# Patient Record
Sex: Female | Born: 1980 | Marital: Married | Smoking: Never smoker
Health system: Southern US, Community
[De-identification: ages and names within clinical notes are randomized; demographics above are authoritative.]

## PROBLEM LIST (undated history)

## (undated) DIAGNOSIS — B977 Papillomavirus as the cause of diseases classified elsewhere: Secondary | ICD-10-CM

## (undated) DIAGNOSIS — R87619 Unspecified abnormal cytological findings in specimens from cervix uteri: Secondary | ICD-10-CM

## (undated) DIAGNOSIS — G43109 Migraine with aura, not intractable, without status migrainosus: Secondary | ICD-10-CM

## (undated) DIAGNOSIS — R51 Headache: Secondary | ICD-10-CM

## (undated) HISTORY — DX: Gilbert syndrome: E80.4

## (undated) HISTORY — DX: Migraine with aura, not intractable, without status migrainosus: G43.109

## (undated) HISTORY — DX: Headache: R51

## (undated) HISTORY — DX: Unspecified abnormal cytological findings in specimens from cervix uteri: R87.619

## (undated) HISTORY — DX: Papillomavirus as the cause of diseases classified elsewhere: B97.7

## (undated) HISTORY — PX: COLPOSCOPY: SHX161

---

## 2014-07-23 DIAGNOSIS — R87810 Cervical high risk human papillomavirus (HPV) DNA test positive: Secondary | ICD-10-CM | POA: Insufficient documentation

## 2014-07-23 DIAGNOSIS — R87610 Atypical squamous cells of undetermined significance on cytologic smear of cervix (ASC-US): Secondary | ICD-10-CM | POA: Insufficient documentation

## 2015-09-29 ENCOUNTER — Encounter: Payer: Self-pay | Admitting: Obstetrics and Gynecology

## 2015-09-29 ENCOUNTER — Ambulatory Visit (INDEPENDENT_AMBULATORY_CARE_PROVIDER_SITE_OTHER): Payer: Managed Care, Other (non HMO) | Admitting: Obstetrics and Gynecology

## 2015-09-29 VITALS — BP 132/80 | HR 64 | Resp 14 | Ht 67.0 in | Wt 157.0 lb

## 2015-09-29 DIAGNOSIS — Z8742 Personal history of other diseases of the female genital tract: Secondary | ICD-10-CM

## 2015-09-29 DIAGNOSIS — N6012 Diffuse cystic mastopathy of left breast: Secondary | ICD-10-CM

## 2015-09-29 DIAGNOSIS — Z01419 Encounter for gynecological examination (general) (routine) without abnormal findings: Secondary | ICD-10-CM

## 2015-09-29 DIAGNOSIS — Z124 Encounter for screening for malignant neoplasm of cervix: Secondary | ICD-10-CM

## 2015-09-29 MED ORDER — NORETHIN ACE-ETH ESTRAD-FE 1.5-30 MG-MCG PO TABS: 1.0000 | ORAL_TABLET | Freq: Every day | ORAL | Status: DC

## 2015-09-29 NOTE — Progress Notes (Signed)
Patient ID: [name], female   DOB: September 05, 1980, 35 y.o.   MRN: [id number] 35 y.o. [id number] MarriedCaucasianF here for annual exam. Happy on OCP's. No dyspareunia.  Period Cycle (Days): 28 Period Duration (Days): 2-3 days  Period Pattern: Regular Menstrual Flow: Light Menstrual Control: Tampon Dysmenorrhea: None  Patient's last menstrual period was 09/29/2015.          Sexually active: Yes.    The current method of family planning is OCP  (estrogen/progesterone).    Exercising: Yes.    cardio and weights  Smoker:  no  Health Maintenance: Pap:  06/2014 Abnormal -had a colposcopy. That was her 2 or 3 abnormal. +HPV. No h/o surgery on her cervix.  History of abnormal Pap:  yes TDaP:  Unsure, thinks she had it a few years ago Garasil: no   reports that she has never smoked. She has never used smokeless tobacco. She reports that she drinks about 3.0 oz of alcohol per week. She reports that she does not use illicit drugs.Kids are kids are 2 and 4 (both girls). She is a stay at home mom. Just moved here from [facility].   Past Medical History  Diagnosis Date  . Abnormal Pap smear of cervix   H/O HTN prior to pregnancy, fine since. No weight changes.   Past Surgical History  Procedure Laterality Date  . Colposcopy    . Cesarean section      Current Outpatient Prescriptions  Medication Sig Dispense Refill  . norethindrone-ethinyl estradiol-iron (MICROGESTIN FE,GILDESS FE,LOESTRIN FE) 1.5-30 MG-MCG tablet Take 1 tablet by mouth daily.     No current facility-administered medications for this visit.    Family History  Problem Relation Age of Onset  . Hypertension Mother   . Lung cancer Mother     non smoker    Review of Systems  Constitutional: Negative.   HENT: Negative.   Eyes: Negative.   Respiratory: Negative.   Cardiovascular: Negative.   Gastrointestinal: Negative.   Endocrine: Negative.   Genitourinary: Negative.   Musculoskeletal: Negative.   Skin: Negative.    Allergic/Immunologic: Negative.   Neurological: Negative.   Psychiatric/Behavioral: Negative.     Exam:   BP 132/80 mmHg  Pulse 64  Resp 14  Ht  (1.702 m)  Wt 157 lb (71.215 kg)  BMI 24.58 kg/m2  LMP 09/29/2015  Weight change: @ Height:   Height:  (170.2 cm)  Ht Readings from Last 3 Encounters:  09/29/15  (1.702 m)    General appearance: alert, cooperative and appears stated age Head: Normocephalic, without obvious abnormality, atraumatic Neck: no adenopathy, supple, symmetrical, trachea midline and thyroid normal to inspection and palpation Lungs: clear to auscultation bilaterally Breasts: increased nodularity in the left breast at 9 o'clock just outside the areolar region. About 1 cm area. No skin dimpling or adenopathy.  Heart: regular rate and rhythm Abdomen: soft, non-tender; bowel sounds normal; no masses,  no organomegaly Extremities: extremities normal, atraumatic, no cyanosis or edema Skin: Skin color, texture, turgor normal. No rashes or lesions Lymph nodes: Cervical, supraclavicular, and axillary nodes normal. No abnormal inguinal nodes palpated Neurologic: Grossly normal   Pelvic: External genitalia:  no lesions              Urethra:  normal appearing urethra with no masses, tenderness or lesions              Bartholins and Skenes: normal  Vagina: normal appearing vagina with normal color and discharge, no lesions              Cervix: no lesions               Bimanual Exam:  Uterus:  normal size, contour, position, consistency, mobility, non-tender and anteverted              Adnexa: no mass, fullness, tenderness               Rectovaginal: Confirms               Anus:  normal sphincter tone, no lesions  Chaperone was present for exam.  A:  Well Woman with normal exam  H/O abnormal pap  Contraception  Increased nodularity in her right breast, suspect fibrocystic changes  P:   Pap with hpv  She will establish  care with a primary MD, will do labs there  She will have her records sent, will check on TDAP  Discussed breast self exam  Discussed calcium and vit D intake  Continue OCP's  Come back for a breast check after her cycle, if still present will set up imaging

## 2015-09-29 NOTE — Patient Instructions (Signed)
Breast Self-Awareness Practicing breast self-awareness may pick up problems early, prevent significant medical complications, and possibly save your life. By practicing breast self-awareness, you can become familiar with how your breasts look and feel and if your breasts are changing. This allows you to notice changes early. It can also offer you some reassurance that your breast health is good. One way to learn what is normal for your breasts and whether your breasts are changing is to do a breast self-exam. If you find a lump or something that was not present in the past, it is best to contact your caregiver right away. Other findings that should be evaluated by your caregiver include nipple discharge, especially if it is bloody; skin changes or reddening; areas where the skin seems to be pulled in (retracted); or new lumps and bumps. Breast pain is seldom associated with cancer (malignancy), but should also be evaluated by a caregiver. HOW TO PERFORM A BREAST SELF-EXAM The best time to examine your breasts is 5-7 days after your menstrual period is over. During menstruation, the breasts are lumpier, and it may be more difficult to pick up changes. If you do not menstruate, have reached menopause, or had your uterus removed (hysterectomy), you should examine your breasts at regular intervals, such as monthly. If you are breastfeeding, examine your breasts after a feeding or after using a breast pump. Breast implants do not decrease the risk for lumps or tumors, so continue to perform breast self-exams as recommended. Talk to your caregiver about how to determine the difference between the implant and breast tissue. Also, talk about the amount of pressure you should use during the exam. Over time, you will become more familiar with the variations of your breasts and more comfortable with the exam. A breast self-exam requires you to remove all your clothes above the waist. 1. Look at your breasts and nipples.  Stand in front of a mirror in a room with good lighting. With your hands on your hips, push your hands firmly downward. Look for a difference in shape, contour, and size from one breast to the other (asymmetry). Asymmetry includes puckers, dips, or bumps. Also, look for skin changes, such as reddened or scaly areas on the breasts. Look for nipple changes, such as discharge, dimpling, repositioning, or redness. 2. Carefully feel your breasts. This is best done either in the shower or tub while using soapy water or when flat on your back. Place the arm (on the side of the breast you are examining) above your head. Use the pads (not the fingertips) of your three middle fingers on your opposite hand to feel your breasts. Start in the underarm area and use  inch (2 cm) overlapping circles to feel your breast. Use 3 different levels of pressure (light, medium, and firm pressure) at each circle before moving to the next circle. The light pressure is needed to feel the tissue closest to the skin. The medium pressure will help to feel breast tissue a little deeper, while the firm pressure is needed to feel the tissue close to the ribs. Continue the overlapping circles, moving downward over the breast until you feel your ribs below your breast. Then, move one finger-width towards the center of the body. Continue to use the  inch (2 cm) overlapping circles to feel your breast as you move slowly up toward the collar bone (clavicle) near the base of the neck. Continue the up and down exam using all 3 pressures until you reach the   middle of the chest. Do this with each breast, carefully feeling for lumps or changes. 3.  Keep a written record with breast changes or normal findings for each breast. By writing this information down, you do not need to depend only on memory for size, tenderness, or location. Write down where you are in your menstrual cycle, if you are still menstruating. Breast tissue can have some lumps or  thick tissue. However, see your caregiver if you find anything that concerns you.  SEEK MEDICAL CARE IF:  You see a change in shape, contour, or size of your breasts or nipples.   You see skin changes, such as reddened or scaly areas on the breasts or nipples.   You have an unusual discharge from your nipples.   You feel a new lump or unusually thick areas.    This information is not intended to replace advice given to you by your health care provider. Make sure you discuss any questions you have with your health care provider.   Document Released: 04/30/2005 Document Revised: 04/16/2012 Document Reviewed: 08/15/2011 Elsevier Interactive Patient Education 2016 Else[employer] Inc[employer]   EXERCISE AND DIET:  We recommended that you start or continue a regular exercise program for good health. Regular exercise means any activity that makes your heart beat faster and makes you sweat.  We recommend exercising at least 30 minutes per day at least 3 days a week, preferably 4 or 5.  We also recommend a diet low in fat and sugar.  Inactivity, poor dietary choices and obesity can cause diabetes, heart attack, stroke, and kidney damage, among others.    ALCOHOL AND SMOKING:  Women should limit their alcohol intake to no more than 7 drinks/beers/glasses of wine (combined, not each!) per week. Moderation of alcohol intake to this level decreases your risk of breast cancer and liver damage. And of course, no recreational drugs are part of a healthy lifestyle.  And absolutely no smoking or even second hand smoke. Most people know smoking can cause heart and lung diseases, but did you know it also contributes to weakening of your bones? Aging of your skin?  Yellowing of your teeth and nails?  CALCIUM AND VITAMIN D:  Adequate intake of calcium and Vitamin D are recommended.  The recommendations for exact amounts of these supplements seem to change often, but generally speaking 600 mg of calcium (either carbonate or  citrate) and 800 units of Vitamin D per day seems prudent. Certain women may benefit from higher intake of Vitamin D.  If you are among these women, your doctor will have told you during your visit.    PAP SMEARS:  Pap smears, to check for cervical cancer or precancers,  have traditionally been done yearly, although recent scientific advances have shown that most women can have pap smears less often.  However, every woman still should have a physical exam from her gynecologist every year. It will include a breast check, inspection of the vulva and vagina to check for abnormal growths or skin changes, a visual exam of the cervix, and then an exam to evaluate the size and shape of the uterus and ovaries.  And after 34 years of age, a rectal exam is indicated to check for rectal cancers. We will also provide age appropriate advice regarding health maintenance, like when you should have certain vaccines, screening for sexually transmitted diseases, bone density testing, colonoscopy, mammograms, etc.   MAMMOGRAMS:  All women over 40 years old should have a yearly   mammogram. Many facilities now offer a "3D" mammogram, which may cost around $50 extra out of pocket. If possible,  we recommend you accept the option to have the 3D mammogram performed.  It both reduces the number of women who will be called back for extra views which then turn out to be normal, and it is better than the routine mammogram at detecting truly abnormal areas.    COLONOSCOPY:  Colonoscopy to screen for colon cancer is recommended for all women at age 50.  We know, you hate the idea of the prep.  We agree, BUT, having colon cancer and not knowing it is worse!!  Colon cancer so often starts as a polyp that can be seen and removed at colonscopy, which can quite literally save your life!  And if your first colonoscopy is normal and you have no family history of colon cancer, most women don't have to have it again for 10 years.  Once every ten  years, you can do something that may end up saving your life, right?  We will be happy to help you get it scheduled when you are ready.  Be sure to check your insurance coverage so you understand how much it will cost.  It may be covered as a preventative service at no cost, but you should check your particular policy.      

## 2015-10-05 NOTE — Addendum Note (Signed)
Addended by: Tobi BastosJERTSON, Ozell Juhasz E on: 10/05/2015 02:12 PM   Modules accepted: Orders

## 2015-10-06 ENCOUNTER — Ambulatory Visit (INDEPENDENT_AMBULATORY_CARE_PROVIDER_SITE_OTHER): Payer: Managed Care, Other (non HMO) | Admitting: Obstetrics and Gynecology

## 2015-10-06 ENCOUNTER — Encounter: Payer: Self-pay | Admitting: Obstetrics and Gynecology

## 2015-10-06 VITALS — BP 140/80 | HR 72 | Resp 15 | Wt 157.0 lb

## 2015-10-06 DIAGNOSIS — N63 Unspecified lump in breast: Secondary | ICD-10-CM

## 2015-10-06 DIAGNOSIS — N6325 Unspecified lump in the left breast, overlapping quadrants: Secondary | ICD-10-CM

## 2015-10-06 DIAGNOSIS — N6320 Unspecified lump in the left breast, unspecified quadrant: Principal | ICD-10-CM

## 2015-10-06 NOTE — Progress Notes (Signed)
Patient is scheduled for Bilateral Breast Diagnostic Mammogram and L Breast Ultrasound at [facility] on 10/07/15 at 1045 . Patient agreeable to time/date/location. Follow up breast check with Dr. Oscar LaJertson 11/16/15 at 1600.

## 2015-10-06 NOTE — Progress Notes (Signed)
Patient ID: Betty PrimroseLiana Cyr, female   DOB: February 15, 1981, 35 y.o.   MRN: [id number] GYNECOLOGY  VISIT   HPI: 35 y.o.   Married  Caucasian  female   G2P2002 with Patient's last menstrual period was 09/29/2015.   here for recheck of left breast. She was noted to have a lump at her annual exam just as her cycle was starting. She isn't sure if it is still there. She is very worried.   GYNECOLOGIC HISTORY: Patient's last menstrual period was 09/29/2015. Contraception:OCP Menopausal hormone therapy:  NONE         OB History    Gravida Para Term Preterm AB TAB SAB Ectopic Multiple Living   2 2 2       2          There are no active problems to display for this patient.   Past Medical History  Diagnosis Date  . Abnormal Pap smear of cervix     Past Surgical History  Procedure Laterality Date  . Colposcopy    . Cesarean section      x 2    Current Outpatient Prescriptions  Medication Sig Dispense Refill  . norethindrone-ethinyl estradiol-iron (MICROGESTIN FE,GILDESS FE,LOESTRIN FE) 1.5-30 MG-MCG tablet Take 1 tablet by mouth daily. 3 Package 3   No current facility-administered medications for this visit.     ALLERGIES: Review of patient's allergies indicates no known allergies.  Family History  Problem Relation Age of Onset  . Hypertension Mother   . Lung cancer Mother     non smoker    Social History   Social History  . Marital Status: Married    Spouse Name: N/A  . Number of Children: N/A  . Years of Education: N/A   Occupational History  . Not on file.   Social History Main Topics  . Smoking status: Never Smoker   . Smokeless tobacco: Never Used  . Alcohol Use: 3.0 oz/week    5 Standard drinks or equivalent per week  . Drug Use: No  . Sexual Activity:    Partners: Male   Other Topics Concern  . Not on file   Social History Narrative    Review of Systems  Constitutional: Negative.   HENT: Negative.   Eyes: Negative.   Respiratory: Negative.    Cardiovascular: Negative.   Gastrointestinal: Negative.   Genitourinary: Negative.   Musculoskeletal: Negative.   Skin: Negative.   Neurological: Negative.   Endo/Heme/Allergies: Negative.   Psychiatric/Behavioral: Negative.     PHYSICAL EXAMINATION:    BP 140/80 mmHg  Pulse 72  Resp 15  Wt 157 lb (71.215 kg)  LMP 09/29/2015    General appearance: alert, cooperative and appears stated age Breasts: In the left breast at 8-9 o'clock, 1 cm from the areola is a 1 cm bean shaped, smooth, mobile lump, not tender. No lumps in the right breast. No skin dimpling. Lymph: no cervical or supraclavicular adenopathy  ASSESSMENT Left breast lump, persists after her cycle    PLAN Diagnostic breast imaging, attention left breast at 8-9 o'clock Discussed breast self exams Discussed likely etiologies of her breast lump (fibroadenoma, cyst, fibrocystic changes), aware the risk of cancer is small F/U breast check in 6 weeks   An After Visit Summary was printed and given to the patient.

## 2015-10-07 LAB — IPS PAP TEST WITH HPV

## 2015-10-12 ENCOUNTER — Encounter: Payer: Self-pay | Admitting: Obstetrics and Gynecology

## 2015-10-12 ENCOUNTER — Telehealth: Payer: Self-pay | Admitting: Emergency Medicine

## 2015-10-12 DIAGNOSIS — IMO0002 Reserved for concepts with insufficient information to code with codable children: Secondary | ICD-10-CM

## 2015-10-12 NOTE — Telephone Encounter (Signed)
Message left to return call to [place] at [phone].   Patient on oral contraception.  LMP 09/29/15 2016 Abnormal -had a colposcopy.  +HPV.   Patient scheduled for breast check 11/16/15 at 1600 with Dr. Oscar LaJertson.  Breast Imaging at [facility] 10/07/15-negative.

## 2015-10-12 NOTE — Telephone Encounter (Signed)
-----   Message from Romualdo BolkJill Evelyn Jertson, MD sent at 10/11/2015 12:15 PM EDT ----- Please inform the patient that her pap returned with ASCUS/+HPV she needs another colposcopy

## 2015-10-12 NOTE — Telephone Encounter (Signed)
Patient notified of message from Dr. Oscar LaJertson.  She is agreeable to scheduling colposcopy. Advised of ASCUS and + HPV results  Brief description of procedure given to patient.  Colposcopy pre-procedure instructions given. Discussed menses and need to not have any bleeding on day of appointment, advised to call to reschedule if starts cycle.  Make sure to eat a meal and hydrate before appointment.  Advised 800 mg of Motrin PO with food one hour prior to appointment.  Patient verbalized understanding of preprocedure instructions and will call to reschedule if will be on menses or has any concerns regarding pregnancy.  Patient is advised she will be contacted with insurance coverage information. cc Becky Frahm/Suzy Dixon.  Colposcopy is scheduled at this time for 10/20/15 at 1500.   Routing to provider for final review. Patient agreeable to disposition. Will close encounter.

## 2015-10-13 ENCOUNTER — Encounter: Payer: Self-pay | Admitting: Obstetrics and Gynecology

## 2015-10-13 ENCOUNTER — Telehealth: Payer: Self-pay

## 2015-10-13 NOTE — Telephone Encounter (Signed)
-----   Message -----    From: Carin PrimroseMCNUTT,Adajah    Sent: 10/12/2015  1:28 PM EDT      To: Romualdo BolkJill Evelyn Jertson, MD Subject: Non-Urgent Medical Question  Hi. I noticed the results of my test indicate high risk HPV. Have you received my records from my previous provider? Can you tell me of my last pap indicated high risk HPV or is this a new development? Thanks. [name]

## 2015-10-13 NOTE — Telephone Encounter (Signed)
Encounter opened in error

## 2015-10-13 NOTE — Telephone Encounter (Signed)
I have notified the patient via mychart that Dr.Jertson will be out of the office today and tomorrow, but she will review previous results upon her return and we will contact her directly to discuss.  Results are not available in scan yet and are not on Dr.Jerton's desk at this time.

## 2015-10-14 NOTE — Telephone Encounter (Signed)
Please inform the patient that I have reviewed her old records and she did have an ASCUS/+HPV pap in 3/16 (negative 16/18). Colpo was negative with negative ECC in 4/16.

## 2015-10-17 NOTE — Telephone Encounter (Signed)
Spoke with patient. Advised of results as seen below from Dr.Jertson. She is agreeable and verbalizes understanding. She is scheduled for her colposcopy on 10/20/2015 with Dr.Jertson and will keep this as planned.  Routing to provider for final review. Patient agreeable to disposition. Will close encounter.

## 2015-10-17 NOTE — Telephone Encounter (Signed)
Left message to call Enma Maeda at [phone]. 

## 2015-10-20 ENCOUNTER — Encounter: Payer: Self-pay | Admitting: Obstetrics and Gynecology

## 2015-10-20 ENCOUNTER — Ambulatory Visit (INDEPENDENT_AMBULATORY_CARE_PROVIDER_SITE_OTHER): Payer: Managed Care, Other (non HMO) | Admitting: Obstetrics and Gynecology

## 2015-10-20 VITALS — BP 120/80 | HR 60 | Resp 16 | Ht 67.0 in | Wt 158.6 lb

## 2015-10-20 DIAGNOSIS — Z01818 Encounter for other preprocedural examination: Secondary | ICD-10-CM

## 2015-10-20 DIAGNOSIS — R896 Abnormal cytological findings in specimens from other organs, systems and tissues: Secondary | ICD-10-CM | POA: Diagnosis not present

## 2015-10-20 DIAGNOSIS — IMO0002 Reserved for concepts with insufficient information to code with codable children: Secondary | ICD-10-CM

## 2015-10-20 LAB — POCT URINE PREGNANCY: PREG TEST UR: NEGATIVE

## 2015-10-20 NOTE — Patient Instructions (Signed)

## 2015-10-20 NOTE — Progress Notes (Signed)
GYNECOLOGY  VISIT   HPI: 35 y.o.   Married  Caucasian  female   G2P2002 with Patient's last menstrual period was 09/29/2015.   here for Colpo. Recent pap with ASCUS/+HPV. I have reviewed her old records and she did have an ASCUS/+HPV pap in 3/16 (negative 16/18). Colpo was negative with negative ECC in 4/16  GYNECOLOGIC HISTORY: Patient's last menstrual period was 09/29/2015. Contraception:OCP Menopausal hormone therapy: None        OB History    Gravida Para Term Preterm AB TAB SAB Ectopic Multiple Living   2 2 2       2          There are no active problems to display for this patient.   Past Medical History  Diagnosis Date  . Abnormal Pap smear of cervix     Past Surgical History  Procedure Laterality Date  . Colposcopy    . Cesarean section      x 2    Current Outpatient Prescriptions  Medication Sig Dispense Refill  . norethindrone-ethinyl estradiol-iron (MICROGESTIN FE,GILDESS FE,LOESTRIN FE) 1.5-30 MG-MCG tablet Take 1 tablet by mouth daily. 3 Package 3   No current facility-administered medications for this visit.     ALLERGIES: Review of patient's allergies indicates no known allergies.  Family History  Problem Relation Age of Onset  . Hypertension Mother   . Lung cancer Mother     non smoker    Social History   Social History  . Marital Status: Married    Spouse Name: N/A  . Number of Children: N/A  . Years of Education: N/A   Occupational History  . Not on file.   Social History Main Topics  . Smoking status: Never Smoker   . Smokeless tobacco: Never Used  . Alcohol Use: 3.0 oz/week    5 Standard drinks or equivalent per week  . Drug Use: No  . Sexual Activity:    Partners: Male   Other Topics Concern  . Not on file   Social History Narrative    ROS  PHYSICAL EXAMINATION:    BP 120/80 mmHg  Pulse 60  Resp 16  Ht 5\' 7"  (1.702 m)  Wt 158 lb 9.6 oz (71.94 kg)  BMI 24.83 kg/m2  LMP 09/29/2015    General appearance: alert,  cooperative and appears stated age  Pelvic: External genitalia:  no lesions              Urethra:  normal appearing urethra with no masses, tenderness or lesions              Bartholins and Skenes: normal                 Vagina: normal appearing vagina with normal color and discharge, no lesions              Cervix: no lesions                Colposcopy: Not satisfactory, no aceto-white changes noted. ECC obtained Negative lugols of the cervix and upper vagina  Chaperone was present for exam.  ASSESSMENT ASCUS/+HPV pap, unsatisfactory colposcopy, no abnormalities noted    PLAN ECC done, further plan based on results Discussed ASCUS, HPV and possible need for leep in the future. If her ECC is negative, she just needs a f/u pap/hpv in 1 year   An After Visit Summary was printed and given to the patient.

## 2015-11-16 ENCOUNTER — Ambulatory Visit: Payer: Managed Care, Other (non HMO) | Admitting: Obstetrics and Gynecology

## 2015-11-24 ENCOUNTER — Ambulatory Visit (INDEPENDENT_AMBULATORY_CARE_PROVIDER_SITE_OTHER): Payer: Managed Care, Other (non HMO) | Admitting: Obstetrics and Gynecology

## 2015-11-24 ENCOUNTER — Encounter: Payer: Self-pay | Admitting: Obstetrics and Gynecology

## 2015-11-24 VITALS — BP 122/80 | HR 52 | Resp 14 | Wt 163.0 lb

## 2015-11-24 DIAGNOSIS — N63 Unspecified lump in unspecified breast: Secondary | ICD-10-CM

## 2015-11-24 NOTE — Progress Notes (Signed)
GYNECOLOGY  VISIT   HPI: 35 y.o.   Married  Caucasian  female   G2P2002 with Patient's last menstrual period was 11/24/2015.   here for follow up left breast check. She was noted to have a breast lump at her annual exam, breast imaging with a benign fatty lobule.  She is here for a f/u breast check   GYNECOLOGIC HISTORY: Patient's last menstrual period was 11/24/2015. Contraception:OCP Menopausal hormone therapy: None        OB History    Gravida Para Term Preterm AB TAB SAB Ectopic Multiple Living   2 2 2       2          There are no active problems to display for this patient.   Past Medical History  Diagnosis Date  . Abnormal Pap smear of cervix     Past Surgical History  Procedure Laterality Date  . Colposcopy    . Cesarean section      x 2    Current Outpatient Prescriptions  Medication Sig Dispense Refill  . norethindrone-ethinyl estradiol-iron (MICROGESTIN FE,GILDESS FE,LOESTRIN FE) 1.5-30 MG-MCG tablet Take 1 tablet by mouth daily. 3 Package 3   No current facility-administered medications for this visit.     ALLERGIES: Review of patient's allergies indicates no known allergies.  Family History  Problem Relation Age of Onset  . Hypertension Mother   . Lung cancer Mother     non smoker    Social History   Social History  . Marital Status: Married    Spouse Name: N/A  . Number of Children: N/A  . Years of Education: N/A   Occupational History  . Not on file.   Social History Main Topics  . Smoking status: Never Smoker   . Smokeless tobacco: Never Used  . Alcohol Use: 3.0 oz/week    5 Standard drinks or equivalent per week  . Drug Use: No  . Sexual Activity:    Partners: Male   Other Topics Concern  . Not on file   Social History Narrative    Review of Systems  Constitutional: Negative.   HENT: Negative.   Eyes: Negative.   Respiratory: Negative.   Cardiovascular: Negative.   Gastrointestinal: Negative.   Genitourinary: Negative.    Musculoskeletal: Negative.   Skin: Negative.   Neurological: Negative.   Endo/Heme/Allergies: Negative.   Psychiatric/Behavioral: Negative.     PHYSICAL EXAMINATION:    BP 122/80 mmHg  Pulse 52  Resp 14  Wt 163 lb (73.936 kg)  LMP 11/24/2015    General appearance: alert, cooperative and appears stated age Breasts: 1 cm bean shaped lump in the left breast at 8-9 o'clock, 1 cm distal to the areolar region. Smooth, mobile, unchanged. No lumps on the right. No skin changes or nipple inversion Lymph: no supraclavicular or axillary adenopathy  ASSESSMENT Stable left breast lump, imaging was benign    PLAN Recommend monthly breast self exams F/U next year for an annual exam, sooner with concerns   An After Visit Summary was printed and given to the patient.

## 2016-05-14 HISTORY — DX: Gilbert syndrome: E80.4

## 2016-09-03 ENCOUNTER — Other Ambulatory Visit: Payer: Self-pay | Admitting: Obstetrics and Gynecology

## 2016-09-03 NOTE — Telephone Encounter (Signed)
Medication refill request: OCP  Last AEX:  09-29-15  Next AEX: 10-04-16 Last MMG (if hormonal medication request): N/A  Refill authorized: please advise

## 2016-10-03 ENCOUNTER — Other Ambulatory Visit: Payer: Self-pay | Admitting: Obstetrics and Gynecology

## 2016-10-04 ENCOUNTER — Encounter: Payer: Self-pay | Admitting: Obstetrics and Gynecology

## 2016-10-04 ENCOUNTER — Other Ambulatory Visit: Payer: Self-pay | Admitting: Obstetrics and Gynecology

## 2016-10-04 ENCOUNTER — Other Ambulatory Visit (HOSPITAL_COMMUNITY)
Admission: RE | Admit: 2016-10-04 | Discharge: 2016-10-04 | Disposition: A | Discharge status: Home / Self Care | Payer: Managed Care, Other (non HMO) | Source: Ambulatory Visit | Attending: Obstetrics and Gynecology | Admitting: Obstetrics and Gynecology

## 2016-10-04 ENCOUNTER — Ambulatory Visit (INDEPENDENT_AMBULATORY_CARE_PROVIDER_SITE_OTHER): Payer: Managed Care, Other (non HMO) | Admitting: Obstetrics and Gynecology

## 2016-10-04 VITALS — BP 118/76 | HR 72 | Resp 16 | Ht 67.5 in | Wt 161.0 lb

## 2016-10-04 DIAGNOSIS — Z124 Encounter for screening for malignant neoplasm of cervix: Secondary | ICD-10-CM | POA: Diagnosis not present

## 2016-10-04 DIAGNOSIS — Z0000 Encounter for general adult medical examination without abnormal findings: Secondary | ICD-10-CM

## 2016-10-04 DIAGNOSIS — Z3041 Encounter for surveillance of contraceptive pills: Secondary | ICD-10-CM | POA: Diagnosis not present

## 2016-10-04 DIAGNOSIS — Z01419 Encounter for gynecological examination (general) (routine) without abnormal findings: Secondary | ICD-10-CM | POA: Diagnosis not present

## 2016-10-04 DIAGNOSIS — R87810 Cervical high risk human papillomavirus (HPV) DNA test positive: Secondary | ICD-10-CM | POA: Diagnosis not present

## 2016-10-04 DIAGNOSIS — B977 Papillomavirus as the cause of diseases classified elsewhere: Secondary | ICD-10-CM

## 2016-10-04 HISTORY — DX: Papillomavirus as the cause of diseases classified elsewhere: B97.7

## 2016-10-04 LAB — CBC
HCT: 44.5 % (ref 35.0–45.0)
Hemoglobin: 15.1 g/dL (ref 11.7–15.5)
MCH: 29.5 pg (ref 27.0–33.0)
MCHC: 33.9 g/dL (ref 32.0–36.0)
MCV: 86.9 fL (ref 80.0–100.0)
MPV: 9.3 fL (ref 7.5–12.5)
Platelets: 349 10*3/uL (ref 140–400)
RBC: 5.12 MIL/uL — AB (ref 3.80–5.10)
RDW: 13.4 % (ref 11.0–15.0)
WBC: 8.3 10*3/uL (ref 3.8–10.8)

## 2016-10-04 LAB — COMPREHENSIVE METABOLIC PANEL
ALBUMIN: 4.1 g/dL (ref 3.6–5.1)
ALK PHOS: 50 U/L (ref 33–115)
ALT: 23 U/L (ref 6–29)
AST: 28 U/L (ref 10–30)
BILIRUBIN TOTAL: 1.5 mg/dL — AB (ref 0.2–1.2)
BUN: 15 mg/dL (ref 7–25)
CALCIUM: 9.2 mg/dL (ref 8.6–10.2)
CO2: 24 mmol/L (ref 20–31)
Chloride: 103 mmol/L (ref 98–110)
Creat: 0.87 mg/dL (ref 0.50–1.10)
Glucose, Bld: 82 mg/dL (ref 65–99)
Potassium: 4.1 mmol/L (ref 3.5–5.3)
Sodium: 138 mmol/L (ref 135–146)
TOTAL PROTEIN: 7 g/dL (ref 6.1–8.1)

## 2016-10-04 LAB — LIPID PANEL
CHOLESTEROL: 182 mg/dL (ref <200)
HDL: 89 mg/dL (ref >50)
LDL CALC: 80 mg/dL (ref <100)
TRIGLYCERIDES: 65 mg/dL (ref <150)
Total CHOL/HDL Ratio: 2 Ratio (ref <5.0)
VLDL: 13 mg/dL (ref <30)

## 2016-10-04 MED ORDER — NORETHIN ACE-ETH ESTRAD-FE 1.5-30 MG-MCG PO TABS: 1.0000 | ORAL_TABLET | Freq: Every day | ORAL | 3 refills | Status: DC

## 2016-10-04 NOTE — Addendum Note (Signed)
Addended by: Tobi BastosJERTSON, Teresha Hanks E on: 10/04/2016 03:24 PM   Modules accepted: Orders

## 2016-10-04 NOTE — Patient Instructions (Signed)
EXERCISE AND DIET:  We recommended that you start or continue a regular exercise program for good health. Regular exercise means any activity that makes your heart beat faster and makes you sweat.  We recommend exercising at least 30 minutes per day at least 3 days a week, preferably 4 or 5.  We also recommend a diet low in fat and sugar.  Inactivity, poor dietary choices and obesity can cause diabetes, heart attack, stroke, and kidney damage, among others.    ALCOHOL AND SMOKING:  Women should limit their alcohol intake to no more than 7 drinks/beers/glasses of wine (combined, not each!) per week. Moderation of alcohol intake to this level decreases your risk of breast cancer and liver damage. And of course, no recreational drugs are part of a healthy lifestyle.  And absolutely no smoking or even second hand smoke. Most people know smoking can cause heart and lung diseases, but did you know it also contributes to weakening of your bones? Aging of your skin?  Yellowing of your teeth and nails?  CALCIUM AND VITAMIN D:  Adequate intake of calcium and Vitamin D are recommended.  The recommendations for exact amounts of these supplements seem to change often, but generally speaking 600 mg of calcium (either carbonate or citrate) and 800 units of Vitamin D per day seems prudent. Certain women may benefit from higher intake of Vitamin D.  If you are among these women, your doctor will have told you during your visit.    PAP SMEARS:  Pap smears, to check for cervical cancer or precancers,  have traditionally been done yearly, although recent scientific advances have shown that most women can have pap smears less often.  However, every woman still should have a physical exam from her gynecologist every year. It will include a breast check, inspection of the vulva and vagina to check for abnormal growths or skin changes, a visual exam of the cervix, and then an exam to evaluate the size and shape of the uterus and  ovaries.  And after 35 years of age, a rectal exam is indicated to check for rectal cancers. We will also provide age appropriate advice regarding health maintenance, like when you should have certain vaccines, screening for sexually transmitted diseases, bone density testing, colonoscopy, mammograms, etc.   MAMMOGRAMS:  All women over 40 years old should have a yearly mammogram. Many facilities now offer a "3D" mammogram, which may cost around $50 extra out of pocket. If possible,  we recommend you accept the option to have the 3D mammogram performed.  It both reduces the number of women who will be called back for extra views which then turn out to be normal, and it is better than the routine mammogram at detecting truly abnormal areas.    COLONOSCOPY:  Colonoscopy to screen for colon cancer is recommended for all women at age 50.  We know, you hate the idea of the prep.  We agree, BUT, having colon cancer and not knowing it is worse!!  Colon cancer so often starts as a polyp that can be seen and removed at colonscopy, which can quite literally save your life!  And if your first colonoscopy is normal and you have no family history of colon cancer, most women don't have to have it again for 10 years.  Once every ten years, you can do something that may end up saving your life, right?  We will be happy to help you get it scheduled when you are ready.    Be sure to check your insurance coverage so you understand how much it will cost.  It may be covered as a preventative service at no cost, but you should check your particular policy.      Breast Self-Awareness Breast self-awareness means being familiar with how your breasts look and feel. It involves checking your breasts regularly and reporting any changes to your health care provider. Practicing breast self-awareness is important. A change in your breasts can be a sign of a serious medical problem. Being familiar with how your breasts look and feel allows  you to find any problems early, when treatment is more likely to be successful. All women should practice breast self-awareness, including women who have had breast implants. How to do a breast self-exam One way to learn what is normal for your breasts and whether your breasts are changing is to do a breast self-exam. To do a breast self-exam: Look for Changes   1. Remove all the clothing above your waist. 2. Stand in front of a mirror in a room with good lighting. 3. Put your hands on your hips. 4. Push your hands firmly downward. 5. Compare your breasts in the mirror. Look for differences between them (asymmetry), such as:  Differences in shape.  Differences in size.  Puckers, dips, and bumps in one breast and not the other. 6. Look at each breast for changes in your skin, such as:  Redness.  Scaly areas. 7. Look for changes in your nipples, such as:  Discharge.  Bleeding.  Dimpling.  Redness.  A change in position. Feel for Changes   Carefully feel your breasts for lumps and changes. It is best to do this while lying on your back on the floor and again while sitting or standing in the shower or tub with soapy water on your skin. Feel each breast in the following way:  Place the arm on the side of the breast you are examining above your head.  Feel your breast with the other hand.  Start in the nipple area and make  inch (2 cm) overlapping circles to feel your breast. Use the pads of your three middle fingers to do this. Apply light pressure, then medium pressure, then firm pressure. The light pressure will allow you to feel the tissue closest to the skin. The medium pressure will allow you to feel the tissue that is a little deeper. The firm pressure will allow you to feel the tissue close to the ribs.  Continue the overlapping circles, moving downward over the breast until you feel your ribs below your breast.  Move one finger-width toward the center of the body.  Continue to use the  inch (2 cm) overlapping circles to feel your breast as you move slowly up toward your collarbone.  Continue the up and down exam using all three pressures until you reach your armpit. Write Down What You Find   Write down what is normal for each breast and any changes that you find. Keep a written record with breast changes or normal findings for each breast. By writing this information down, you do not need to depend only on memory for size, tenderness, or location. Write down where you are in your menstrual cycle, if you are still menstruating. If you are having trouble noticing differences in your breasts, do not get discouraged. With time you will become more familiar with the variations in your breasts and more comfortable with the exam. How often should I examine my   breasts? Examine your breasts every month. If you are breastfeeding, the best time to examine your breasts is after a feeding or after using a breast pump. If you menstruate, the best time to examine your breasts is 5-7 days after your period is over. During your period, your breasts are lumpier, and it may be more difficult to notice changes. When should I see my health care provider? See your health care provider if you notice:  A change in shape or size of your breasts or nipples.  A change in the skin of your breast or nipples, such as a reddened or scaly area.  Unusual discharge from your nipples.  A lump or thick area that was not there before.  Pain in your breasts.  Anything that concerns you. This information is not intended to replace advice given to you by your health care provider. Make sure you discuss any questions you have with your health care provider. Document Released: 04/30/2005 Document Revised: 10/06/2015 Document Reviewed: 03/20/2015 Elsevier Interactive Patient Education  2017 [employer]  

## 2016-10-04 NOTE — Progress Notes (Signed)
36 y.o. [id number] MarriedCaucasianF here for annual exam.   Period Cycle (Days): 28 Period Duration (Days): 3 Period Pattern: Regular Menstrual Flow: Moderate, Light Menstrual Control: Tampon Menstrual Control Change Freq (Hours): 3 Dysmenorrhea: (!) Moderate Dysmenorrhea Symptoms: Cramping  Cramps are tolerable with ibuprofen. Sexually active no pain.   Patient's last menstrual period was 09/30/2016.          Sexually active: Yes.    The current method of family planning is OCP (estrogen/progesterone).    Exercising: Yes.    cardio/weight training Smoker:  no  Health Maintenance: Pap:  09-29-15 ASCUS + HR HPV -colposcopy was unsatisfactory, ECC was negative In 316 ASCUS/+HPV (negative 16/18), colposcopy and ECC were negative History of abnormal Pap:  yes MMG:  10-07-15 left breast U/S WNL- screening starting at age 35 Colonoscopy:  Never BMD:   Never TDaP:  04-01-13 Gardasil: N/A   reports that she has never smoked. She has never used smokeless tobacco. She reports that she drinks about 3.0 oz of alcohol per week . 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   Past Medical History:  Diagnosis Date  . Abnormal Pap smear of cervix     Past Surgical History:  Procedure Laterality Date  . CESAREAN SECTION     x 2  . COLPOSCOPY      Current Outpatient Prescriptions  Medication Sig Dispense Refill  . BLISOVI FE 1.5/30 1.5-30 MG-MCG tablet TAKE 1 TABLET BY MOUTH DAILY 28 tablet 0   No current facility-administered medications for this visit.     Family History  Problem Relation Age of Onset  . Hypertension Mother   . Lung cancer Mother        non smoker    Review of Systems  Exam:   BP 118/76 (BP Location: Right Arm, Patient Position: Sitting, Cuff Size: Normal)   Pulse 72   Resp 16   Ht 5' 7.5" (1.715 m)   Wt 161 lb (73 kg)   LMP 09/30/2016   BMI 24.84 kg/m   Weight change: @WEIGHTCHANGE @ Height:   Height: 5' 7.5"  (171.5 cm)  Ht Readings from Last 3 Encounters:  10/04/16 5' 7.5" (1.715 m)  10/20/15 5\' 7"  (1.702 m)  09/29/15 5\' 7"  (1.702 m)    General appearance: alert, cooperative and appears stated age Head: Normocephalic, without obvious abnormality, atraumatic Neck: no adenopathy, supple, symmetrical, trachea midline and thyroid normal to inspection and palpation Lungs: clear to auscultation bilaterally Cardiovascular: regular rate and rhythm Breasts: stable 1 cm lump in the left breast between 8 and 9 o'clock a few cm from the areolar region, no other changes Abdomen: soft, non-tender; bowel sounds normal; no masses,  no organomegaly Extremities: extremities normal, atraumatic, no cyanosis or edema Skin: Skin color, texture, turgor normal. No rashes or lesions Lymph nodes: Cervical, supraclavicular, and axillary nodes normal. No abnormal inguinal nodes palpated Neurologic: Grossly normal   Pelvic: External genitalia:  no lesions              Urethra:  normal appearing urethra with no masses, tenderness or lesions              Bartholins and Skenes: normal                 Vagina: normal appearing vagina with normal color and discharge, no lesions              Cervix: no lesions  Bimanual Exam:  Uterus:  normal size, contour, position, consistency, mobility, non-tender              Adnexa: no mass, fullness, tenderness               Rectovaginal: Confirms               Anus:  normal sphincter tone, no lesions  Chaperone was present for exam.  A:  Well Woman with normal exam  H/O ASCUS, +HPV pap  H/O breast lump left breast, benign imaging, stable exam   P:   Continue OCP's  Pap with hpv  Discussed breast self exam  Discussed calcium and vit D intake  Screening labs

## 2016-10-09 LAB — CYTOLOGY - PAP
Diagnosis: NEGATIVE
HPV: DETECTED — AB

## 2016-10-11 ENCOUNTER — Telehealth: Payer: Self-pay

## 2016-10-11 LAB — BILIRUBIN, FRACTIONATED(TOT/DIR/INDIR)
BILIRUBIN TOTAL: 1.3 mg/dL — AB (ref 0.2–1.2)
Bilirubin, Direct: 0.3 mg/dL — ABNORMAL HIGH (ref <=0.2)
Indirect Bilirubin: 1 mg/dL (ref 0.2–1.2)

## 2016-10-11 NOTE — Telephone Encounter (Signed)
Spoke with patient. Results given as seen below from Dr.Jertson. Patient verbalizes understanding. Reports she is taking OCP, but started her pack a few days late due to delay in picking up rx. Advised patient will need to contact the office with the first day of her next menses so that colposcopy can be scheduled. Patient verbalizes understanding.  Notes recorded by Romualdo BolkJertson, Jill Evelyn, MD on 10/10/2016 at 2:17 PM EDT Please let the patient know that her pap returned as normal, but still + for hpv. Because of her pap history, she needs another colposcopy. Her total bilirubin is slightly elevated. I've asked Baird LyonsCasey to add a direct and indirect bilirubin. Further recommendations depending on that result.  The rest of her lab work was normal.  Routing to provider for final review. Patient agreeable to disposition. Will close encounter.

## 2016-10-11 NOTE — Telephone Encounter (Signed)
-----   Message from Romualdo BolkJill Evelyn Jertson, MD sent at 10/10/2016  2:17 PM EDT ----- Please let the patient know that her pap returned as normal, but still + for hpv. Because of her pap history, she needs another colposcopy. Her total bilirubin is slightly elevated. I've asked Baird LyonsCasey to add a direct and indirect bilirubin. Further recommendations depending on that result.  The rest of her lab work was normal.

## 2016-11-02 ENCOUNTER — Telehealth: Payer: Self-pay | Admitting: Obstetrics and Gynecology

## 2016-11-02 DIAGNOSIS — B977 Papillomavirus as the cause of diseases classified elsewhere: Secondary | ICD-10-CM

## 2016-11-02 NOTE — Telephone Encounter (Signed)
Patient started menses on 11/01/2016 and would like to schedule colposcopy. Appointment scheduled for 11/07/2016 at 10 am with Dr.Jertson. Patient is agreeable to date and time.   Instructions given. Motrin 800 mg po x , one hour before appointment with food. Make sure to eat a meal before appointment and drink plenty of fluids. Patient verbalized understanding and will call to reschedule if will be on menses or has any concerns regarding pregnancy. Patient agreeable and verbalized understanding of all instructions. Colposcopy order placed.  Routing to provider for final review. Patient agreeable to disposition. Will close encounter.

## 2016-11-02 NOTE — Telephone Encounter (Signed)
Patient called requesting to schedule a colposcopy.

## 2016-11-07 ENCOUNTER — Ambulatory Visit (INDEPENDENT_AMBULATORY_CARE_PROVIDER_SITE_OTHER): Payer: Managed Care, Other (non HMO) | Admitting: Obstetrics and Gynecology

## 2016-11-07 ENCOUNTER — Encounter: Payer: Self-pay | Admitting: Obstetrics and Gynecology

## 2016-11-07 VITALS — BP 120/70 | HR 60 | Resp 16 | Ht 67.5 in | Wt 162.0 lb

## 2016-11-07 DIAGNOSIS — Z01812 Encounter for preprocedural laboratory examination: Secondary | ICD-10-CM | POA: Diagnosis not present

## 2016-11-07 DIAGNOSIS — Z8742 Personal history of other diseases of the female genital tract: Secondary | ICD-10-CM

## 2016-11-07 DIAGNOSIS — B977 Papillomavirus as the cause of diseases classified elsewhere: Secondary | ICD-10-CM

## 2016-11-07 LAB — POCT URINE PREGNANCY: Preg Test, Ur: NEGATIVE

## 2016-11-07 NOTE — Patient Instructions (Signed)

## 2016-11-07 NOTE — Progress Notes (Signed)
GYNECOLOGY  VISIT   HPI: 36 y.o.   Married  Caucasian  female   G2P2002 with Patient's last menstrual period was 11/01/2016.   here for colposcopy, recent pap was negative, but still +HPV   Pap history: Pap:  09-29-15 ASCUS + HR HPV -colposcopy was unsatisfactory, ECC was negative In 316 ASCUS/+HPV (negative 16/18), colposcopy and ECC were negative  GYNECOLOGIC HISTORY: Patient's last menstrual period was 11/01/2016. Contraception: OCP Menopausal hormone therapy: None        OB History    Gravida Para Term Preterm AB Living   2 2 2     2    SAB TAB Ectopic Multiple Live Births           2         There are no active problems to display for this patient.   Past Medical History:  Diagnosis Date  . Abnormal Pap smear of cervix   . Gilberts disease 2018  . 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 CESAREAN SECTION     x 2  . COLPOSCOPY      Current Outpatient Prescriptions  Medication Sig Dispense Refill  . norethindrone-ethinyl estradiol-iron (BLISOVI FE 1.5/30) 1.5-30 MG-MCG tablet Take 1 tablet by mouth daily. 3 Package 3   No current facility-administered medications for this visit.      ALLERGIES: Patient has no known allergies.  Family History  Problem Relation Age of Onset  . Hypertension Mother   . Lung cancer Mother        non smoker    Social History   Social History  . Marital status: Married    Spouse name: N/A  . Number of children: N/A  . Years of education: N/A   Occupational History  . Not on file.   Social History Main Topics  . Smoking status: Never Smoker  . Smokeless tobacco: Never Used  . Alcohol use 3.0 oz/week    5 Standard drinks or equivalent per week  . Drug use: No  . Sexual activity: Yes    Partners: Male   Other Topics Concern  . Not on file   Social History Narrative  . No narrative on file    Review of Systems  Constitutional: Negative.    HENT: Negative.   Eyes: Negative.   Respiratory: Negative.   Cardiovascular: Negative.   Gastrointestinal: Negative.   Genitourinary: Negative.   Musculoskeletal: Negative.   Skin: Negative.   Neurological: Negative.   Endo/Heme/Allergies: Negative.   Psychiatric/Behavioral: Negative.     PHYSICAL EXAMINATION:    BP 120/70 (BP Location: Right Arm, Patient Position: Sitting, Cuff Size: Normal)   Pulse 60   Resp 16   Ht 5' 7.5" (1.715 m)   Wt 162 lb (73.5 kg)   LMP 11/01/2016   BMI 25.00 kg/m     General appearance: alert, cooperative and appears stated age   Pelvic: External genitalia:  no lesions              Urethra:  normal appearing urethra with no masses, tenderness or lesions              Bartholins and Skenes: normal                 Vagina: normal appearing vagina with normal color and discharge, no lesions              Cervix:  grossly normal  Colposcopy: unsatisfactory, slight acetowhite changes noted on the edge of the cervix at 1 o'clock, edge of it not well seen, mild acetowhite changes at 7 o'clock. Biopsies of the cervix at 1 and 7 o'clock done, ECC done Chaperone was present for exam.  ASSESSMENT H/O abnormal paps, this year with normal pap, but +HPV    PLAN Colposcopy with biopsies and ECC Plans depending on results   An After Visit Summary was printed and given to the patient.

## 2016-11-15 ENCOUNTER — Telehealth: Payer: Self-pay

## 2016-11-15 NOTE — Telephone Encounter (Signed)
Spoke with patient. Advised of results as seen below froM Dr.Jertson. Patient is agreeable and verbalizes understanding. 08 recall placed.  Routing to provider for final review. Patient agreeable to disposition. Will close encounter.

## 2016-11-15 NOTE — Telephone Encounter (Signed)
-----   Message from Romualdo BolkJill Evelyn Jertson, MD sent at 11/12/2016  5:20 PM EDT ----- Please inform [facility] I. She needs a f/u pap and hpv in one year.

## 2017-04-08 ENCOUNTER — Telehealth: Payer: Self-pay | Admitting: Obstetrics and Gynecology

## 2017-04-08 NOTE — Telephone Encounter (Signed)
Spoke with patient. Patient found a lump in her left breast on 04/05/2017. Reports it is very small. Denies any redness, swelling, or nipple discharge. Advised will need to be seen for further evaluation. Patient is unable to be seen until 04/10/2017. Appointment scheduled for 04/10/2017 at 10:45 am. Patient is agreeable to date and time.  Routing to provider for final review. Patient agreeable to disposition. Will close encounter.

## 2017-04-08 NOTE — Telephone Encounter (Signed)
Patient found a lump in her left breast.  

## 2017-04-10 ENCOUNTER — Ambulatory Visit: Payer: Managed Care, Other (non HMO) | Admitting: Obstetrics and Gynecology

## 2017-04-10 ENCOUNTER — Other Ambulatory Visit: Payer: Self-pay

## 2017-04-10 ENCOUNTER — Encounter: Payer: Self-pay | Admitting: Obstetrics and Gynecology

## 2017-04-10 VITALS — BP 150/96 | HR 68 | Resp 14 | Wt 168.2 lb

## 2017-04-10 DIAGNOSIS — N6321 Unspecified lump in the left breast, upper outer quadrant: Secondary | ICD-10-CM | POA: Diagnosis not present

## 2017-04-10 NOTE — Progress Notes (Signed)
GYNECOLOGY  VISIT   HPI: 36 y.o.   Married  Caucasian  female   G2P2002 with Patient's last menstrual period was 03/25/2017.   here for evaluation of a left breast lump. She noticed it when she was scratching, it is small and not tender.  This is in the upper outer quadrant of the left breast, near the axilla.  She was noted to have a left breast lump in 2017 at 8-9 o'clock, negative imaging and a stable f/u exam.   GYNECOLOGIC HISTORY: Patient's last menstrual period was 03/25/2017. Contraception: OCP  Menopausal hormone therapy: none        OB History    Gravida Para Term Preterm AB Living   2 2 2     2    SAB TAB Ectopic Multiple Live Births           2         There are no active problems to display for this patient.   Past Medical History:  Diagnosis Date  . Abnormal Pap smear of cervix   . Gilberts disease 2018  . 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 CESAREAN SECTION     x 2  . COLPOSCOPY      Current Outpatient Medications  Medication Sig Dispense Refill  . norethindrone-ethinyl estradiol-iron (BLISOVI FE 1.5/30) 1.5-30 MG-MCG tablet Take 1 tablet by mouth daily. 3 Package 3   No current facility-administered medications for this visit.      ALLERGIES: Patient has no known allergies.  Family History  Problem Relation Age of Onset  . Hypertension Mother   . Lung cancer Mother        non smoker    Social History   Socioeconomic History  . Marital status: Married    Spouse name: Not on file  . Number of children: Not on file  . Years of education: Not on file  . Highest education level: Not on file  Social Needs  . Financial resource strain: Not on file  . Food insecurity - worry: Not on file  . Food insecurity - inability: Not on file  . Transportation needs - medical: Not on file  . Transportation needs - non-medical: Not on file  Occupational History  . Not on file   Tobacco Use  . Smoking status: Never Smoker  . Smokeless tobacco: Never Used  Substance and Sexual Activity  . Alcohol use: Yes    Alcohol/week: 3.0 oz    Types: 5 Standard drinks or equivalent per week  . Drug use: No  . Sexual activity: Yes    Partners: Male  Other Topics Concern  . Not on file  Social History Narrative  . Not on file    Review of Systems  Constitutional: Negative.   HENT: Negative.   Eyes: Negative.   Respiratory: Negative.   Cardiovascular: Negative.   Gastrointestinal: Negative.   Genitourinary:       Breast lump  Musculoskeletal: Negative.   Skin: Negative.   Neurological: Negative.   Endo/Heme/Allergies: Negative.   Psychiatric/Behavioral: Negative.     PHYSICAL EXAMINATION:    BP (!) 150/96 (BP Location: Right Arm, Patient Position: Sitting, Cuff Size: Normal)   Pulse 68   Resp 14   Wt 168 lb 4 oz (76.3 kg)   LMP 03/25/2017   BMI 25.96 kg/m     General appearance: alert, cooperative and appears stated age  Breasts: there is a 1-2 mm lump in the left breast at 1-2 o'clock near the periphery. Feels superficial, can't feel it when she is lying down. Not clear if it is her skin or breast. The other previously felt lump was not noted today. No skin changes. Normal right breast exam No axillary or supraclavicular adenopathy   ASSESSMENT New lump in her left breast, very tiny and very superficial Elevated BP today, likely secondary to being worried. Prior BP's were normal    PLAN Will have her f/u for a repeat exam after her cycle. If still present will set up imaging.  Will recheck her BP at her f/u visit   An After Visit Summary was printed and given to the patient.

## 2017-04-24 ENCOUNTER — Ambulatory Visit (INDEPENDENT_AMBULATORY_CARE_PROVIDER_SITE_OTHER): Payer: Managed Care, Other (non HMO) | Admitting: Obstetrics and Gynecology

## 2017-04-24 ENCOUNTER — Telehealth: Payer: Self-pay | Admitting: Obstetrics and Gynecology

## 2017-04-24 ENCOUNTER — Other Ambulatory Visit: Payer: Self-pay

## 2017-04-24 ENCOUNTER — Encounter: Payer: Self-pay | Admitting: Obstetrics and Gynecology

## 2017-04-24 VITALS — BP 120/80 | HR 72 | Resp 16 | Wt 167.0 lb

## 2017-04-24 DIAGNOSIS — N6321 Unspecified lump in the left breast, upper outer quadrant: Secondary | ICD-10-CM | POA: Diagnosis not present

## 2017-04-24 NOTE — Progress Notes (Signed)
Appointment scheduled for bilateral diagnostic mammogram with left breast ultrasound at [facility] on 05/09/2017 at 8:45 am. Patient declines earlier appointment. Placed in mammogram hold.

## 2017-04-24 NOTE — Telephone Encounter (Signed)
Spoke with patient. Patient would like to move her breast imaging appointment to an earlier date. Now has child care. Ad[facility] and return call with new appointment date and time. Patient is agreeable.  [facility] for bilateral diagnostic imaging and left ultrasound scheduled for 05/02/2017 at 9:45 am.  Spoke with patient who is agreeable to new appointment date and time. Patient is in mammogram hold.  Routing to provider for final review. Patient agreeable to disposition. Will close encounter.

## 2017-04-24 NOTE — Progress Notes (Signed)
GYNECOLOGY  VISIT   HPI: 36 y.o.   Married  Caucasian  female   G2P2002 with Patient's last menstrual period was 04/18/2017.   here for left breast recheck. She noticed the lump about 3 weeks ago. Her cycle just finished. The lump is small, but still there, not tender. Feels it better sitting up.   GYNECOLOGIC HISTORY: Patient's last menstrual period was 04/18/2017. Contraception:OCP Menopausal hormone therapy: none        OB History    Gravida Para Term Preterm AB Living   2 2 2     2    SAB TAB Ectopic Multiple Live Births           2         There are no active problems to display for this patient.   Past Medical History:  Diagnosis Date  . Abnormal Pap smear of cervix   . Gilberts disease 2018  . 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 CESAREAN SECTION     x 2  . COLPOSCOPY      Current Outpatient Medications  Medication Sig Dispense Refill  . norethindrone-ethinyl estradiol-iron (BLISOVI FE 1.5/30) 1.5-30 MG-MCG tablet Take 1 tablet by mouth daily. 3 Package 3   No current facility-administered medications for this visit.      ALLERGIES: Patient has no known allergies.  Family History  Problem Relation Age of Onset  . Hypertension Mother   . Lung cancer Mother        non smoker    Social History   Socioeconomic History  . Marital status: Married    Spouse name: Not on file  . Number of children: Not on file  . Years of education: Not on file  . Highest education level: Not on file  Social Needs  . Financial resource strain: Not on file  . Food insecurity - worry: Not on file  . Food insecurity - inability: Not on file  . Transportation needs - medical: Not on file  . Transportation needs - non-medical: Not on file  Occupational History  . Not on file  Tobacco Use  . Smoking status: Never Smoker  . Smokeless tobacco: Never Used  Substance and Sexual Activity  . Alcohol  use: Yes    Alcohol/week: 3.0 oz    Types: 5 Standard drinks or equivalent per week  . Drug use: No  . Sexual activity: Yes    Partners: Male  Other Topics Concern  . Not on file  Social History Narrative  . Not on file    Review of Systems  Constitutional: Negative.   HENT: Negative.   Eyes: Negative.   Respiratory: Negative.   Cardiovascular: Negative.   Gastrointestinal: Negative.   Genitourinary: Negative.   Musculoskeletal: Negative.   Skin: Negative.   Neurological: Negative.   Endo/Heme/Allergies: Negative.   Psychiatric/Behavioral: Negative.     PHYSICAL EXAMINATION:    BP 120/80 (BP Location: Right Arm, Patient Position: Sitting, Cuff Size: Normal)   Pulse 72   Resp 16   Wt 167 lb (75.8 kg)   LMP 04/18/2017   BMI 25.77 kg/m     General appearance: alert, cooperative and appears stated age Breasts: 1-2 mm lump, superficial in the left breast in the upper outer quadrant, no change. Stable lump in the left breast at 8-9 o'clock. No skin changes.   No axillary or supraclavicular adenopathy  ASSESSMENT Persistent,  small left breast lump    PLAN Will set up imaging and a f/u exam in 6 weeks.    An After Visit Summary was printed and given to the patient.

## 2017-04-24 NOTE — Telephone Encounter (Addendum)
Patient was seen today and is [facility] to [facility]?

## 2017-04-25 ENCOUNTER — Ambulatory Visit: Payer: Managed Care, Other (non HMO) | Admitting: Obstetrics and Gynecology

## 2017-05-14 DIAGNOSIS — R519 Headache, unspecified: Secondary | ICD-10-CM

## 2017-05-14 HISTORY — DX: Headache, unspecified: R51.9

## 2017-06-03 ENCOUNTER — Encounter: Payer: Self-pay | Admitting: Obstetrics and Gynecology

## 2017-06-05 NOTE — Progress Notes (Signed)
GYNECOLOGY  VISIT   HPI: 37 y.o.   Married  Caucasian  female   G2P2002 with Patient's last menstrual period was 05/17/2017 (exact date).   here for breast recheck. The patient was seen with a 1-2 mm superficial lump in the left breast 6 weeks ago. Imaging was negative, a fat lobule was noted in the area of concern. She also has a lump in the left breast at 8-9 o'clock that has been stable on exam. She doesn't notice any changes in her breast self exam. Kids are 3 and 5.  GYNECOLOGIC HISTORY: Patient's last menstrual period was 05/17/2017 (exact date). Contraception:OCPs--Blisovi Fe 1.5/30 Menopausal hormone therapy: none        OB History    Gravida Para Term Preterm AB Living   2 2 2     2    SAB TAB Ectopic Multiple Live Births           2         There are no active problems to display for this patient.   Past Medical History:  Diagnosis Date  . Abnormal Pap smear of cervix   . Gilberts disease 2018  . 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 CESAREAN SECTION     x 2  . COLPOSCOPY      Current Outpatient Medications  Medication Sig Dispense Refill  . norethindrone-ethinyl estradiol-iron (BLISOVI FE 1.5/30) 1.5-30 MG-MCG tablet Take 1 tablet by mouth daily. 3 Package 3   No current facility-administered medications for this visit.      ALLERGIES: Patient has no known allergies.  Family History  Problem Relation Age of Onset  . Hypertension Mother   . Lung cancer Mother        non smoker    Social History   Socioeconomic History  . Marital status: Married    Spouse name: Not on file  . Number of children: Not on file  . Years of education: Not on file  . Highest education level: Not on file  Social Needs  . Financial resource strain: Not on file  . Food insecurity - worry: Not on file  . Food insecurity - inability: Not on file  . Transportation needs - medical: Not on file  .  Transportation needs - non-medical: Not on file  Occupational History  . Not on file  Tobacco Use  . Smoking status: Never Smoker  . Smokeless tobacco: Never Used  Substance and Sexual Activity  . Alcohol use: Yes    Alcohol/week: 3.0 oz    Types: 5 Standard drinks or equivalent per week  . Drug use: No  . Sexual activity: Yes    Partners: Male  Other Topics Concern  . Not on file  Social History Narrative  . Not on file    ROS  PHYSICAL EXAMINATION:    BP 122/60 (BP Location: Right Arm, Patient Position: Sitting, Cuff Size: Normal)   Pulse 60   Wt 167 lb (75.8 kg)   LMP 05/17/2017 (Exact Date)   BMI 25.77 kg/m     General appearance: alert, cooperative and appears stated age Breasts: right breast exam is normal. Stable lump in the left breast between 8-9 o'clock, ~1 cm mobiile. Second 1-2 mm lump is noted in the upper outer quadrant of the left breast between 1-2 o'clock, stable. No skin changes, no nipple retraction.   No supraclavicular or axillary adenopathy  ASSESSMENT 2 lumps in the left breast, negative imaging, stable exam    PLAN Continue monthly breast self exams F/U in 5/19 for an annual exam.    An After Visit Summary was printed and given to the patient.

## 2017-06-07 ENCOUNTER — Ambulatory Visit: Payer: Managed Care, Other (non HMO) | Admitting: Obstetrics and Gynecology

## 2017-06-07 ENCOUNTER — Encounter: Payer: Self-pay | Admitting: Obstetrics and Gynecology

## 2017-06-07 VITALS — BP 122/60 | HR 60 | Wt 167.0 lb

## 2017-06-07 DIAGNOSIS — N63 Unspecified lump in unspecified breast: Secondary | ICD-10-CM

## 2017-10-14 NOTE — Progress Notes (Signed)
37 y.o. [id number] MarriedCaucasianF here for annual exam. She has been evaluated for left breast lumps, she is no longer feeling the lumps.   She was on OCP's for cycle, vasectomy for contraception. She ran out of pills a few months ago and her cycles have been okay.  Period Cycle (Days): 30 Period Duration (Days): 4 days Period Pattern: Regular Menstrual Flow: Moderate(heavy at times--has been off OCPs) Menstrual Control: Tampon Menstrual Control Change Freq (Hours): every 2 hours on heaviest day Dysmenorrhea: (!) Moderate Dysmenorrhea Symptoms: Cramping  Bad cramps for one day, helped with advil and a heating pad. In the spring she was having bad headaches, had a negative brain MRI. She does have auras.   Patient's last menstrual period was 10/05/2017 (exact date).          Sexually active: Yes.    The current method of family planning is vasectomy Exercising: Yes.    cardio and weights. 5 days a week Smoker:  no  Health Maintenance: Pap: 10-04-16 Neg:Pos HR HPV, 09-29-15 ASCUS:Pos HR HPV History of abnormal Pap:  Yes, Hx CIN I 10/2016, 09-29-15 ASCUS + HR HPV -colposcopy was unsatisfactory, ECC was negative In 316 ASCUS/+HPV (negative 16/18), colposcopy and ECC were negative MMG: 05-02-17 Bil.Diag.neg;Lt.Br.U/S neg/BiRads1.Screening 5 years Colonoscopy:  n/a BMD:   n/a TDaP:  04-01-13 Gardasil: n/a, discussed the option.    reports that she has never smoked. She has never used smokeless tobacco. She reports that she drinks about 1.8 oz of alcohol per week. 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   Past Medical History:  Diagnosis Date  . Abnormal Pap smear of cervix   . Gilberts disease 2018  . 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 New onset of headaches 2019    Past Surgical History:  Procedure Laterality Date  . CESAREAN SECTION     x 2  . COLPOSCOPY      Current Outpatient Medications  Medication Sig  Dispense Refill  . rizatriptan (MAXALT-MLT) 10 MG disintegrating tablet Take 1 tablet by mouth as needed.  2   No current facility-administered medications for this visit.     Family History  Problem Relation Age of Onset  . Hypertension Mother   . Lung cancer Mother        non smoker    Review of Systems  Constitutional: Negative.   HENT: Negative.   Eyes: Negative.   Respiratory: Negative.   Cardiovascular: Negative.   Gastrointestinal: Negative.   Endocrine: Negative.   Genitourinary: Negative.   Musculoskeletal: Negative.   Skin: Negative.   Allergic/Immunologic: Negative.   Neurological: Positive for headaches.  Psychiatric/Behavioral: Negative.     Exam:   BP 118/72 (BP Location: Right Arm, Patient Position: Sitting, Cuff Size: Normal)   Pulse 60   Resp 16   Ht 5\' 7"  (1.702 m)   Wt 168 lb 12.8 oz (76.6 kg)   LMP 10/05/2017 (Exact Date)   BMI 26.44 kg/m   Weight change: @WEIGHTCHANGE @ Height:   Height: 5\' 7"  (170.2 cm)  Ht Readings from Last 3 Encounters:  10/16/17 5\' 7"  (1.702 m)  11/07/16 5' 7.5" (1.715 m)  10/04/16 5' 7.5" (1.715 m)    General appearance: alert, cooperative and appears stated age Head: Normocephalic, without obvious abnormality, atraumatic Neck: no adenopathy, supple, symmetrical, trachea midline and thyroid normal to inspection and palpation Lungs: clear to auscultation bilaterally Cardiovascular:  regular rate and rhythm Breasts: stable left breast lump at 8-9 o'clock, 1 cm lima bean shaped, mobile, non tender  lump. No other lumps noted. No skin changes Abdomen: soft, non-tender; non distended,  no masses,  no organomegaly Extremities: extremities normal, atraumatic, no cyanosis or edema Skin: Skin color, texture, turgor normal. No rashes or lesions Lymph nodes: Cervical, supraclavicular, and axillary nodes normal. No abnormal inguinal nodes palpated Neurologic: Grossly normal   Pelvic: External genitalia:  no lesions               Urethra:  normal appearing urethra with no masses, tenderness or lesions              Bartholins and Skenes: normal                 Vagina: normal appearing vagina with normal color and discharge, no lesions              Cervix: no lesions               Bimanual Exam:  Uterus:  normal size, contour, position, consistency, mobility, non-tender              Adnexa: no mass, fullness, tenderness               Rectovaginal: Confirms               Anus:  normal sphincter tone, no lesions  Chaperone was present for exam.  A:  Well Woman with normal exam  Diagnosed with migraines with aura's this spring, on supplements for prevention   Dysmenorrhea  H/O CIN I  Stable left breast lump    P:   Pap with hpv  Discussed that she shouldn't go back on OCP's with her h/o auras  Discussed breast self exam  Discussed calcium and vit D intake  Screening labs  Next mammogram at 41  Anaprox DS for cramps

## 2017-10-16 ENCOUNTER — Encounter: Payer: Self-pay | Admitting: Obstetrics and Gynecology

## 2017-10-16 ENCOUNTER — Other Ambulatory Visit (HOSPITAL_COMMUNITY)
Admission: RE | Admit: 2017-10-16 | Discharge: 2017-10-16 | Disposition: A | Discharge status: Home / Self Care | Payer: Managed Care, Other (non HMO) | Source: Ambulatory Visit | Attending: Obstetrics and Gynecology | Admitting: Obstetrics and Gynecology

## 2017-10-16 ENCOUNTER — Other Ambulatory Visit: Payer: Self-pay

## 2017-10-16 ENCOUNTER — Ambulatory Visit: Payer: Managed Care, Other (non HMO) | Admitting: Obstetrics and Gynecology

## 2017-10-16 VITALS — BP 118/72 | HR 60 | Resp 16 | Ht 67.0 in | Wt 168.8 lb

## 2017-10-16 DIAGNOSIS — Z124 Encounter for screening for malignant neoplasm of cervix: Secondary | ICD-10-CM | POA: Diagnosis present

## 2017-10-16 DIAGNOSIS — G43109 Migraine with aura, not intractable, without status migrainosus: Secondary | ICD-10-CM

## 2017-10-16 DIAGNOSIS — Z01419 Encounter for gynecological examination (general) (routine) without abnormal findings: Secondary | ICD-10-CM

## 2017-10-16 DIAGNOSIS — N946 Dysmenorrhea, unspecified: Secondary | ICD-10-CM

## 2017-10-16 DIAGNOSIS — Z0000 Encounter for general adult medical examination without abnormal findings: Secondary | ICD-10-CM | POA: Insufficient documentation

## 2017-10-16 MED ORDER — NAPROXEN SODIUM 550 MG PO TABS: 550.0000 mg | ORAL_TABLET | Freq: Two times a day (BID) | ORAL | 2 refills | Status: DC

## 2017-10-16 NOTE — Patient Instructions (Signed)

## 2017-10-17 LAB — LIPID PANEL
CHOL/HDL RATIO: 2.2 ratio (ref 0.0–4.4)
Cholesterol, Total: 190 mg/dL (ref 100–199)
HDL: 86 mg/dL (ref >39)
LDL CALC: 84 mg/dL (ref 0–99)
TRIGLYCERIDES: 99 mg/dL (ref 0–149)
VLDL Cholesterol Cal: 20 mg/dL (ref 5–40)

## 2017-10-17 LAB — COMPREHENSIVE METABOLIC PANEL
ALK PHOS: 83 IU/L (ref 39–117)
ALT: 52 IU/L — AB (ref 0–32)
AST: 35 IU/L (ref 0–40)
Albumin/Globulin Ratio: 2 (ref 1.2–2.2)
Albumin: 4.9 g/dL (ref 3.5–5.5)
BUN/Creatinine Ratio: 19 (ref 9–23)
BUN: 16 mg/dL (ref 6–20)
Bilirubin Total: 1.5 mg/dL — ABNORMAL HIGH (ref 0.0–1.2)
CALCIUM: 10 mg/dL (ref 8.7–10.2)
CO2: 23 mmol/L (ref 20–29)
CREATININE: 0.84 mg/dL (ref 0.57–1.00)
Chloride: 103 mmol/L (ref 96–106)
GFR calc Af Amer: 103 mL/min/{1.73_m2} (ref >59)
GFR, EST NON AFRICAN AMERICAN: 90 mL/min/{1.73_m2} (ref >59)
GLOBULIN, TOTAL: 2.5 g/dL (ref 1.5–4.5)
GLUCOSE: 92 mg/dL (ref 65–99)
Potassium: 4.6 mmol/L (ref 3.5–5.2)
SODIUM: 142 mmol/L (ref 134–144)
Total Protein: 7.4 g/dL (ref 6.0–8.5)

## 2017-10-17 LAB — CBC
HEMATOCRIT: 44.1 % (ref 34.0–46.6)
Hemoglobin: 14.8 g/dL (ref 11.1–15.9)
MCH: 29.3 pg (ref 26.6–33.0)
MCHC: 33.6 g/dL (ref 31.5–35.7)
MCV: 87 fL (ref 79–97)
PLATELETS: 386 10*3/uL (ref 150–450)
RBC: 5.05 x10E6/uL (ref 3.77–5.28)
RDW: 13.7 % (ref 12.3–15.4)
WBC: 7.1 10*3/uL (ref 3.4–10.8)

## 2017-10-18 ENCOUNTER — Telehealth: Payer: Self-pay

## 2017-10-18 LAB — CYTOLOGY - PAP
DIAGNOSIS: NEGATIVE
HPV (WINDOPATH): NOT DETECTED

## 2017-10-18 NOTE — Telephone Encounter (Signed)
-----   Message from Romualdo BolkJill Evelyn Jertson, MD sent at 10/18/2017  1:08 PM EDT ----- Please let the patient know that her ALT and bilirubin were both slightly elevated and have her f/u with her primary for evaluation. Please send a copy of her labs to her primary. Her other blood work was normal. Pap is pending.

## 2017-10-18 NOTE — Telephone Encounter (Signed)
Left message to call Kaitlyn at [phone]. 

## 2017-10-22 NOTE — Telephone Encounter (Signed)
Patient returning Kaitlyn's call. °

## 2017-10-23 NOTE — Telephone Encounter (Signed)
Spoke with patient. Results given. Patient will contact PCP regarding lab results. Labs sent to patient's PCP on file. Encounter closed.

## 2017-11-25 ENCOUNTER — Telehealth: Payer: Self-pay | Admitting: Obstetrics and Gynecology

## 2017-11-25 NOTE — Telephone Encounter (Signed)
Patient stated that she was on a birth control for a while and decided that she would like to try being off of it. Patient stated that she spoke with Dr. Oscar LaJertson about it, and she said if she changes her mind, she can call and we would refill it.

## 2017-11-25 NOTE — Telephone Encounter (Signed)
Return call to patient. Left message to call back to triage nurse.  Ppreviously on Lo Estrin 1.5/30 ( generic) for cycle control. Husband with vasectomy.     Per last office visit note- new diagnosis migraines with aura. No further OCP's.

## 2017-12-13 NOTE — Telephone Encounter (Signed)
Follow-up call to patient. Per ROI can leave message on voice mail, which has name confirmation.  Left message calling to follow-up on My Chart message. Call back and ask for triage nurse.

## 2017-12-13 NOTE — Telephone Encounter (Signed)
OK to close

## 2017-12-19 NOTE — Telephone Encounter (Signed)
Spoke with patient. Requesting to restart birth control. Had forgotten about recommendations, no OCP with hx of migraines with aura.   Patient states she has always experienced "horrible skin issues", always improved with OCP. Patient states she has now been off OCP for several months and skin has worsened. Reports "irriatation" mostly on face and shoulders. Denies itching, rash or States she has seen dermatology in the past multiple times.   Patient asking if POP would be appropriate alternative to try to improve skin?  Dr. Oscar LaJertson, please advise.

## 2017-12-19 NOTE — Telephone Encounter (Signed)
No, POP can actually cause acne. I would recommend she f/u with dermatology.

## 2017-12-20 NOTE — Telephone Encounter (Signed)
Spoke with patient, advised as seen below per Dr. Jertson. Patient verbalizes understanding and is agreeable. Encounter closed.  

## 2018-11-03 ENCOUNTER — Ambulatory Visit: Payer: Managed Care, Other (non HMO) | Admitting: Obstetrics and Gynecology

## 2018-11-28 ENCOUNTER — Telehealth: Payer: Self-pay | Admitting: Obstetrics and Gynecology

## 2018-11-28 NOTE — Telephone Encounter (Signed)
Patient complains of right breast nipple pain and requesting appointment with Dr.Jertson. Denies fever or redness. Advised Dr.Jertson not in office today.Patient would like to wait for Dr.Jertson. Made appointment for 12-01-18 9:15am.

## 2018-11-28 NOTE — Telephone Encounter (Signed)
Left message to call Amanda, CMA. °

## 2018-11-28 NOTE — Telephone Encounter (Signed)
Patient is having right breast nipple pain.

## 2018-12-01 ENCOUNTER — Other Ambulatory Visit: Payer: Self-pay

## 2018-12-01 ENCOUNTER — Encounter: Payer: Self-pay | Admitting: Obstetrics and Gynecology

## 2018-12-01 ENCOUNTER — Ambulatory Visit (INDEPENDENT_AMBULATORY_CARE_PROVIDER_SITE_OTHER): Payer: 59 | Admitting: Obstetrics and Gynecology

## 2018-12-01 ENCOUNTER — Telehealth: Payer: Self-pay | Admitting: Obstetrics and Gynecology

## 2018-12-01 ENCOUNTER — Other Ambulatory Visit (HOSPITAL_COMMUNITY)
Admission: RE | Admit: 2018-12-01 | Discharge: 2018-12-01 | Disposition: A | Discharge status: Home / Self Care | Payer: Managed Care, Other (non HMO) | Source: Ambulatory Visit | Attending: Obstetrics and Gynecology | Admitting: Obstetrics and Gynecology

## 2018-12-01 VITALS — BP 110/80 | HR 72 | Temp 98.7°F | Wt 168.4 lb

## 2018-12-01 DIAGNOSIS — Z124 Encounter for screening for malignant neoplasm of cervix: Secondary | ICD-10-CM

## 2018-12-01 DIAGNOSIS — Z01419 Encounter for gynecological examination (general) (routine) without abnormal findings: Secondary | ICD-10-CM

## 2018-12-01 DIAGNOSIS — N946 Dysmenorrhea, unspecified: Secondary | ICD-10-CM | POA: Diagnosis not present

## 2018-12-01 DIAGNOSIS — Z0000 Encounter for general adult medical examination without abnormal findings: Secondary | ICD-10-CM

## 2018-12-01 DIAGNOSIS — N920 Excessive and frequent menstruation with regular cycle: Secondary | ICD-10-CM

## 2018-12-01 MED ORDER — NAPROXEN SODIUM 550 MG PO TABS: 550.0000 mg | ORAL_TABLET | Freq: Two times a day (BID) | ORAL | 2 refills | Status: DC

## 2018-12-01 NOTE — Telephone Encounter (Signed)
Call placed to patient to review benefits and scheduled recommended ultrasound. Left voicemail message requesting a return call °

## 2018-12-01 NOTE — Progress Notes (Signed)
38 y.o. [id number] Married Caucasian  female here for annual exam and breast check. She has had breast lumps with negative imaging in the past. Last week her right breast was very tender, bump on the skin right next to the nipple. Now it's going away, hasn't drained. She doesn't feel any other lumps.   Her cycle has gotten very heavy and worsening cramps in the last 4-5 months. She will have a day with light bleeding, stops for a week, then heavy cycle will start. Bleeding is typically 4-5 days, but last cycle was 7 days. She is saturating a super tampon in one hour for almost the whole cycle.  She is taking advil as soon as she feels the cramps. She is taking 600 mg of advil BID.  Sexually active, no pain.   No thyroid c/o.   Period Cycle (Days): 28 Period Duration (Days): 4-5 days Period Pattern: Regular Menstrual Flow: Heavy Menstrual Control: Tampon Menstrual Control Change Freq (Hours): changes tampon every hour Dysmenorrhea: (!) Severe Dysmenorrhea Symptoms: Cramping  Patient's last menstrual period was 11/17/2018 (approximate).          Sexually active: Yes.     The current method of family planning is vasectomy.    Exercising: Yes.    running, cardio, body weights Smoker:  no  Health Maintenance: Pap: 10/16/2017 WNL NEG HPV, 10-04-16 Neg:Pos HR HPV, 09-29-15 ASCUS:Pos HR HPV History of abnormal Pap:  Yes, Hx CIN I 10/2016, 09-29-15 ASCUS + HR HPV -colposcopywas unsatisfactory, ECC was negative In 316 ASCUS/+HPV (negative 16/18), colposcopy and ECC were negative MMG: 05-02-17 Bil.Diag.neg;Lt.Br.U/S neg/BiRads1.Screening 5 years Colonoscopy:  n/a BMD:   n/a TDaP:  04-01-13 Gardasil: No    reports that she has never smoked. She has never used smokeless tobacco. She reports current alcohol use of about 3.0 standard drinks of alcohol per week. She reports that she does not use drugs. 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   Past Medical History:  Diagnosis Date  . Abnormal  Pap smear of cervix   . Gilberts disease 2018  . 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 Migraine with aura   . New onset of headaches 2019    Past Surgical History:  Procedure Laterality Date  . CESAREAN SECTION     x 2  . COLPOSCOPY      Current Outpatient Medications  Medication Sig Dispense Refill  . naproxen sodium (ANAPROX DS) 550 MG tablet Take 1 tablet (550 mg total) by mouth 2 (two) times daily with a meal. 30 tablet 2  . rizatriptan (MAXALT-MLT) 10 MG disintegrating tablet Take 1 tablet by mouth as needed.  2   No current facility-administered medications for this visit.     Family History  Problem Relation Age of Onset  . Hypertension Mother   . Lung cancer Mother        non smoker    Review of Systems  Constitutional:       Right breast mass and tenderness  HENT: Negative.   Eyes: Negative.   Respiratory: Negative.   Cardiovascular: Negative.   Gastrointestinal: Negative.   Endocrine: Negative.   Genitourinary: Negative.   Musculoskeletal: Negative.   Allergic/Immunologic: Negative.   Neurological: Negative.   Hematological: Negative.   Psychiatric/Behavioral: Negative.     Exam:   BP 110/80 (BP Location: Right Arm, Patient Position: Sitting, Cuff Size: Normal)   Pulse 72   Temp 98.7 F (37.1  C) (Skin)   Wt 168 lb 6.4 oz (76.4 kg)   LMP 11/17/2018 (Approximate)   BMI 26.38 kg/m   Weight change: @WEIGHTCHANGE @ Height:      Ht Readings from Last 3 Encounters:  10/16/17 5\' 7"  (1.702 m)  11/07/16 5' 7.5" (1.715 m)  10/04/16 5' 7.5" (1.715 m)    General appearance: alert, cooperative and appears stated age Head: Normocephalic, without obvious abnormality, atraumatic Neck: no adenopathy, supple, symmetrical, trachea midline and thyroid normal to inspection and palpation Lungs: clear to auscultation bilaterally Cardiovascular: regular rate and rhythm Breasts: On the right areola at 1 o'clock is a resolving boil, in the left  breast at 8-9 o'clock is a stable lima bean shaped lump, max of 1 cm.  Abdomen: soft, non-tender; non distended,  no masses,  no organomegaly Extremities: extremities normal, atraumatic, no cyanosis or edema Skin: Skin color, texture, turgor normal. No rashes or lesions Lymph nodes: Cervical, supraclavicular, and axillary nodes normal. No abnormal inguinal nodes palpated Neurologic: Grossly normal   Pelvic: External genitalia:  no lesions              Urethra:  normal appearing urethra with no masses, tenderness or lesions              Bartholins and Skenes: normal                 Vagina: normal appearing vagina with normal color and discharge, no lesions              Cervix: no lesions               Bimanual Exam:  Uterus:  normal size, contour, position, consistency, mobility, non-tender              Adnexa: no mass, fullness, tenderness               Rectovaginal: Confirms               Anus:  normal sphincter tone, no lesions  Chaperone was present for exam.  A:  Well Woman with normal exam  New onset menorrhagia  Severe dysmenorrhea  Resolving boil right areolar region  Stable left breast lump  P:   Pap with reflex hpv (patient desires)  Return for a gyn ultrasound  Not a candidate for OCP's  Discussed the option of the minipill or the mirena IUD for cycle control  Discussed breast self exam  Discussed calcium and vit D intake  Screening labs, TSH, Ferritin  Mammogram at 41

## 2018-12-01 NOTE — Patient Instructions (Signed)
EXERCISE AND DIET:  We recommended that you start or continue a regular exercise program for good health. Regular exercise means any activity that makes your heart beat faster and makes you sweat.  We recommend exercising at least 30 minutes per day at least 3 days a week, preferably 4 or 5.  We also recommend a diet low in fat and sugar.  Inactivity, poor dietary choices and obesity can cause diabetes, heart attack, stroke, and kidney damage, among others.    ALCOHOL AND SMOKING:  Women should limit their alcohol intake to no more than 7 drinks/beers/glasses of wine (combined, not each!) per week. Moderation of alcohol intake to this level decreases your risk of breast cancer and liver damage. And of course, no recreational drugs are part of a healthy lifestyle.  And absolutely no smoking or even second hand smoke. Most people know smoking can cause heart and lung diseases, but did you know it also contributes to weakening of your bones? Aging of your skin?  Yellowing of your teeth and nails?  CALCIUM AND VITAMIN D:  Adequate intake of calcium and Vitamin D are recommended.  The recommendations for exact amounts of these supplements seem to change often, but generally speaking 1,000 Menorrhagia  Menorrhagia is a condition in which menstrual periods are heavy or last longer than normal. With menorrhagia, most periods a woman has may cause enough blood loss and cramping that she becomes unable to take part in her usual activities. What are the causes? Common causes of this condition include:  Noncancerous growths in the uterus (polyps or fibroids).  An imbalance of the estrogen and progesterone hormones.  One of the ovaries not releasing an egg during one or more months.  A problem with the thyroid gland (hypothyroid).  Side effects of having an intrauterine device (IUD).  Side effects of some medicines, such as anti-inflammatory medicines or blood thinners.  A bleeding disorder that stops the  blood from clotting normally. In some cases, the cause of this condition is not known. What are the signs or symptoms? Symptoms of this condition include:  Routinely having to change your pad or tampon every 1-2 hours because it is completely soaked.  Needing to use pads and tampons at the same time because of heavy bleeding.  Needing to wake up to change your pads or tampons during the night.  Passing blood clots larger than 1 inch (2.5 cm) in size.  Having bleeding that lasts for more than 7 days.  Having symptoms of low iron levels (anemia), such as tiredness, fatigue, or shortness of breath. How is this diagnosed? This condition may be diagnosed based on:  A physical exam.  Your symptoms and menstrual history.  Tests, such as: ? Blood tests to check if you are pregnant or have hormonal changes, a bleeding or thyroid disorder, anemia, or other problems. ? Pap test to check for cancerous changes, infections, or inflammation. ? Endometrial biopsy. This test involves removing a tissue sample from the lining of the uterus (endometrium) to be examined under a microscope. ? Pelvic ultrasound. This test uses sound waves to create images of your uterus, ovaries, and vagina. The images can show if you have fibroids or other growths. ? Hysteroscopy. For this test, a small telescope is used to look inside your uterus. How is this treated? Treatment may not be needed for this condition. If it is needed, the best treatment for you will depend on:  Whether you need to prevent pregnancy.  Your  desire to have children in the future.  The cause and severity of your bleeding.  Your personal preference. Medicines are the first step in treatment. You may be treated with:  Hormonal birth control methods. These treatments reduce bleeding during your menstrual period. They include: ? Birth control pills. ? Skin patch. ? Vaginal ring. ? Shots (injections) that you get every 3 months. ?  Hormonal IUD (intrauterine device). ? Implants that go under the skin.  Medicines that thicken blood and slow bleeding.  Medicines that reduce swelling, such as ibuprofen.  Medicines that contain an artificial (synthetic) hormone called progestin.  Medicines that make the ovaries stop working for a short time.  Iron supplements to treat anemia. If medicines do not work, surgery may be done. Surgical options may include:  Dilation and curettage (D&C). In this procedure, your health care provider opens (dilates) your cervix and then scrapes or suctions tissue from the endometrium to reduce menstrual bleeding.  Operative hysteroscopy. In this procedure, a small tube with a light on the end (hysteroscope) is used to view your uterus and help remove polyps that may be causing heavy periods.  Endometrial ablation. This is when various techniques are used to permanently destroy your entire endometrium. After endometrial ablation, most women have little or no menstrual flow. This procedure reduces your ability to become pregnant.  Endometrial resection. In this procedure, an electrosurgical wire loop is used to remove the endometrium. This procedure reduces your ability to become pregnant.  Hysterectomy. This is surgical removal of the uterus. This is a permanent procedure that stops menstrual periods. Pregnancy is not possible after a hysterectomy. Follow these instructions at home: Medicines  Take over-the-counter and prescription medicines exactly as told by your health care provider. This includes iron pills.  Do not change or switch medicines without asking your health care provider.  Do not take aspirin or medicines that contain aspirin 1 week before or during your menstrual period. Aspirin may make bleeding worse. General instructions  If you need to change your sanitary pad or tampon more than once every 2 hours, limit your activity until the bleeding stops.  Iron pills can cause  constipation. To prevent or treat constipation while you are taking prescription iron supplements, your health care provider may recommend that you: ? Drink enough fluid to keep your urine clear or pale yellow. ? Take over-the-counter or prescription medicines. ? Eat foods that are high in fiber, such as fresh fruits and vegetables, whole grains, and beans. ? Limit foods that are high in fat and processed sugars, such as fried and sweet foods.  Eat well-balanced meals, including foods that are high in iron. Foods that have a lot of iron include leafy green vegetables, meat, liver, eggs, and whole grain breads and cereals.  Do not try to lose weight until the abnormal bleeding has stopped and your blood iron level is back to normal. If you need to lose weight, work with your health care provider to lose weight safely.  Keep all follow-up visits as told by your health care provider. This is important. Contact a health care provider if:  You soak through a pad or tampon every 1 or 2 hours, and this happens every time you have a period.  You need to use pads and tampons at the same time because you are bleeding so much.  You have nausea, vomiting, diarrhea, or other problems related to medicines you are taking. Get help right away if:  You soak through  more than a pad or tampon in 1 hour.  You pass clots bigger than 1 inch (2.5 cm) wide.  You feel short of breath.  You feel like your heart is beating too fast.  You feel dizzy or faint.  You feel very weak or tired. Summary  Menorrhagia is a condition in which menstrual periods are heavy or last longer than normal.  Treatment will depend on the cause of the condition and may include medicines or procedures.  Take over-the-counter and prescription medicines exactly as told by your health care provider. This includes iron pills.  Get help right away if you have heavy bleeding that soaks through more than a pad or tampon in 1 hour, you  are passing large clots, or you feel dizzy, faint or short of breath. This information is not intended to replace advice given to you by your health care provider. Make sure you discuss any questions you have with your health care provider. Document Released: 04/30/2005 Document Revised: 08/07/2017 Document Reviewed: 04/23/2016 Elsevier Patient Education  2020 [employer]  mg of calcium (between diet and supplement) and 800 units of Vitamin D per day seems prudent. Certain women may benefit from higher intake of Vitamin D.  If you are among these women, your doctor will have told you during your visit.    PAP SMEARS:  Pap smears, to check for cervical cancer or precancers,  have traditionally been done yearly, although recent scientific advances have shown that most women can have pap smears less often.  However, every woman still should have a physical exam from her gynecologist every year. It will include a breast check, inspection of the vulva and vagina to check for abnormal growths or skin changes, a visual exam of the cervix, and then an exam to evaluate the size and shape of the uterus and ovaries.  And after 37 years of age, a rectal exam is indicated to check for rectal cancers. We will also provide age appropriate advice regarding health maintenance, like when you should have certain vaccines, screening for sexually transmitted diseases, bone density testing, colonoscopy, mammograms, etc.   MAMMOGRAMS:  All women over 37 years old should have a yearly mammogram. Many facilities now offer a "3D" mammogram, which may cost around $50 extra out of pocket. If possible,  we recommend you accept the option to have the 3D mammogram performed.  It both reduces the number of women who will be called back for extra views which then turn out to be normal, and it is better than the routine mammogram at detecting truly abnormal areas.    COLON CANCER SCREENING: Now recommend starting at age 37. At this time  colonoscopy is not covered for routine screening until 50. There are take home tests that can be done between 45-49.   COLONOSCOPY:  Colonoscopy to screen for colon cancer is recommended for all women at age 37.  We know, you hate the idea of the prep.  We agree, BUT, having colon cancer and not knowing it is worse!!  Colon cancer so often starts as a polyp that can be seen and removed at colonscopy, which can quite literally save your life!  And if your first colonoscopy is normal and you have no family history of colon cancer, most women don't have to have it again for 10 years.  Once every ten years, you can do something that may end up saving your life, right?  We will be happy to help you get  it scheduled when you are ready.  Be sure to check your insurance coverage so you understand how much it will cost.  It may be covered as a preventative service at no cost, but you should check your particular policy.      Breast Self-Awareness Breast self-awareness means being familiar with how your breasts look and feel. It involves checking your breasts regularly and reporting any changes to your health care provider. Practicing breast self-awareness is important. A change in your breasts can be a sign of a serious medical problem. Being familiar with how your breasts look and feel allows you to find any problems early, when treatment is more likely to be successful. All women should practice breast self-awareness, including women who have had breast implants. How to do a breast self-exam One way to learn what is normal for your breasts and whether your breasts are changing is to do a breast self-exam. To do a breast self-exam: Look for Changes  1. Remove all the clothing above your waist. 2. Stand in front of a mirror in a room with good lighting. 3. Put your hands on your hips. 4. Push your hands firmly downward. 5. Compare your breasts in the mirror. Look for differences between them (asymmetry),  such as: ? Differences in shape. ? Differences in size. ? Puckers, dips, and bumps in one breast and not the other. 6. Look at each breast for changes in your skin, such as: ? Redness. ? Scaly areas. 7. Look for changes in your nipples, such as: ? Discharge. ? Bleeding. ? Dimpling. ? Redness. ? A change in position. Feel for Changes Carefully feel your breasts for lumps and changes. It is best to do this while lying on your back on the floor and again while sitting or standing in the shower or tub with soapy water on your skin. Feel each breast in the following way:  Place the arm on the side of the breast you are examining above your head.  Feel your breast with the other hand.  Start in the nipple area and make  inch (2 cm) overlapping circles to feel your breast. Use the pads of your three middle fingers to do this. Apply light pressure, then medium pressure, then firm pressure. The light pressure will allow you to feel the tissue closest to the skin. The medium pressure will allow you to feel the tissue that is a little deeper. The firm pressure will allow you to feel the tissue close to the ribs.  Continue the overlapping circles, moving downward over the breast until you feel your ribs below your breast.  Move one finger-width toward the center of the body. Continue to use the  inch (2 cm) overlapping circles to feel your breast as you move slowly up toward your collarbone.  Continue the up and down exam using all three pressures until you reach your armpit.  Write Down What You Find  Write down what is normal for each breast and any changes that you find. Keep a written record with breast changes or normal findings for each breast. By writing this information down, you do not need to depend only on memory for size, tenderness, or location. Write down where you are in your menstrual cycle, if you are still menstruating. If you are having trouble noticing differences in your  breasts, do not get discouraged. With time you will become more familiar with the variations in your breasts and more comfortable with the exam. How often  should I examine my breasts? Examine your breasts every month. If you are breastfeeding, the best time to examine your breasts is after a feeding or after using a breast pump. If you menstruate, the best time to examine your breasts is 5-7 days after your period is over. During your period, your breasts are lumpier, and it may be more difficult to notice changes. When should I see my health care provider? See your health care provider if you notice:  A change in shape or size of your breasts or nipples.  A change in the skin of your breast or nipples, such as a reddened or scaly area.  Unusual discharge from your nipples.  A lump or thick area that was not there before.  Pain in your breasts.  Anything that concerns you.

## 2018-12-03 LAB — COMPREHENSIVE METABOLIC PANEL WITH GFR
ALT: 42 IU/L — ABNORMAL HIGH (ref 0–32)
AST: 25 IU/L (ref 0–40)
Albumin/Globulin Ratio: 2 (ref 1.2–2.2)
Albumin: 4.6 g/dL (ref 3.8–4.8)
Alkaline Phosphatase: 79 IU/L (ref 39–117)
BUN/Creatinine Ratio: 16 (ref 9–23)
BUN: 13 mg/dL (ref 6–20)
Bilirubin Total: 1.3 mg/dL — ABNORMAL HIGH (ref 0.0–1.2)
CO2: 22 mmol/L (ref 20–29)
Calcium: 9.6 mg/dL (ref 8.7–10.2)
Chloride: 101 mmol/L (ref 96–106)
Creatinine, Ser: 0.83 mg/dL (ref 0.57–1.00)
GFR calc Af Amer: 104 mL/min/1.73 (ref >59)
GFR calc non Af Amer: 90 mL/min/1.73 (ref >59)
Globulin, Total: 2.3 g/dL (ref 1.5–4.5)
Glucose: 90 mg/dL (ref 65–99)
Potassium: 4.4 mmol/L (ref 3.5–5.2)
Sodium: 138 mmol/L (ref 134–144)
Total Protein: 6.9 g/dL (ref 6.0–8.5)

## 2018-12-03 LAB — LIPID PANEL
Chol/HDL Ratio: 1.9 ratio (ref 0.0–4.4)
Cholesterol, Total: 158 mg/dL (ref 100–199)
HDL: 82 mg/dL (ref >39)
LDL Calculated: 66 mg/dL (ref 0–99)
Triglycerides: 52 mg/dL (ref 0–149)
VLDL Cholesterol Cal: 10 mg/dL (ref 5–40)

## 2018-12-03 LAB — CBC
Hematocrit: 43.4 % (ref 34.0–46.6)
Hemoglobin: 14.5 g/dL (ref 11.1–15.9)
MCH: 29.1 pg (ref 26.6–33.0)
MCHC: 33.4 g/dL (ref 31.5–35.7)
MCV: 87 fL (ref 79–97)
Platelets: 375 10*3/uL (ref 150–450)
RBC: 4.98 x10E6/uL (ref 3.77–5.28)
RDW: 12.6 % (ref 11.7–15.4)
WBC: 6.4 10*3/uL (ref 3.4–10.8)

## 2018-12-03 LAB — CYTOLOGY - PAP: Diagnosis: NEGATIVE

## 2018-12-03 LAB — FERRITIN: Ferritin: 59 ng/mL (ref 15–150)

## 2018-12-03 LAB — TSH: TSH: 0.916 u[IU]/mL (ref 0.450–4.500)

## 2018-12-03 NOTE — Telephone Encounter (Signed)
Second call placed to patient to scheduled recommended ultrasound and to convey benefits. Left voicemail message requesting a return call.

## 2018-12-08 ENCOUNTER — Ambulatory Visit: Payer: Managed Care, Other (non HMO) | Admitting: Obstetrics and Gynecology

## 2018-12-11 NOTE — Progress Notes (Deleted)
GYNECOLOGY  VISIT   HPI: 38 y.o.   Married Betty SchneidersUnavailable Unavailable  female   [phone] with Patient's last menstrual period was 11/17/2018 (approximate).   here for     GYNECOLOGIC HISTORY: Patient's last menstrual period was 11/17/2018 (approximate). Contraception:*** Menopausal hormone therapy: ***        OB History    Gravida  2   Para  2   Term  2   Preterm      AB      Living  2     SAB      TAB      Ectopic      Multiple      Live Births  2              Patient Active Problem List   Diagnosis Date Noted  . Migraine with aura     Past Medical History:  Diagnosis Date  . Abnormal Pap smear of cervix   . Gilberts disease 2018  . 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 Migraine with aura   . New onset of headaches 2019    Past Surgical History:  Procedure Laterality Date  . CESAREAN SECTION     x 2  . COLPOSCOPY      Current Outpatient Medications  Medication Sig Dispense Refill  . naproxen sodium (ANAPROX DS) 550 MG tablet Take 1 tablet (550 mg total) by mouth 2 (two) times daily with a meal. Prn cramps 30 tablet 2  . rizatriptan (MAXALT-MLT) 10 MG disintegrating tablet Take 1 tablet by mouth as needed.  2   No current facility-administered medications for this visit.      ALLERGIES: Patient has no known allergies.  Family History  Problem Relation Age of Onset  . Hypertension Mother   . Lung cancer Mother        non smoker    Social History   Socioeconomic History  . Marital status: Married    Spouse name: Not on file  . Number of children: Not on file  . Years of education: Not on file  . Highest education level: Not on file  Occupational History  . Not on file  Social Needs  . Financial resource strain: Not on file  . Food insecurity    Worry: Not on file    Inability: Not on file  . Transportation needs    Medical: Not on file    Non-medical: Not on file  Tobacco Use  . Smoking status: Never  Smoker  . Smokeless tobacco: Never Used  Substance and Sexual Activity  . Alcohol use: Yes    Alcohol/week: 3.0 standard drinks    Types: 3 Standard drinks or equivalent per week  . Drug use: No  . Sexual activity: Yes    Partners: Male  Lifestyle  . Physical activity    Days per week: Not on file    Minutes per session: Not on file  . Stress: Not on file  Relationships  . Social Musicianconnections    Talks on phone: Not on file    Gets together: Not on file    Attends religious service: Not on file    Active member of club or organization: Not on file    Attends meetings of clubs or organizations: Not on file    Relationship status: Not on file  . Intimate partner violence    Fear of current or ex partner: Not on file  Emotionally abused: Not on file    Physically abused: Not on file    Forced sexual activity: Not on file  Other Topics Concern  . Not on file  Social History Narrative  . Not on file    ROS  PHYSICAL EXAMINATION:    LMP 11/17/2018 (Approximate)     General appearance: alert, cooperative and appears stated age Neck: no adenopathy, supple, symmetrical, trachea midline and thyroid {CHL AMB PHY EX THYROID NORM DEFAULT:[phone]::"normal to inspection and palpation"} Breasts: {Exam; breast:13139::"normal appearance, no masses or tenderness"} Abdomen: soft, non-tender; non distended, no masses,  no organomegaly  Pelvic: External genitalia:  no lesions              Urethra:  normal appearing urethra with no masses, tenderness or lesions              Bartholins and Skenes: normal                 Vagina: normal appearing vagina with normal color and discharge, no lesions              Cervix: {CHL AMB PHY EX CERVIX NORM [phone]::"no lesions"}              Bimanual Exam:  Uterus:  {CHL AMB PHY EX UTERUS NORM DEFAULT:[phone]::"normal size, contour, position, consistency, mobility, non-tender"}              Adnexa: {CHL AMB PHY EX ADNEXA NO MASS  [phone]::"no mass, fullness, tenderness"}              Rectovaginal: {yes no:[id number]}.  Confirms.              Anus:  normal sphincter tone, no lesions  Chaperone was present for exam.  ASSESSMENT     PLAN    An After Visit Summary was printed and given to the patient.  *** minutes face to face time of which over 50% was spent in counseling.

## 2018-12-12 ENCOUNTER — Other Ambulatory Visit: Payer: Self-pay

## 2018-12-13 ENCOUNTER — Encounter: Payer: Self-pay | Admitting: Obstetrics and Gynecology

## 2018-12-15 ENCOUNTER — Telehealth: Payer: Self-pay | Admitting: Obstetrics and Gynecology

## 2018-12-15 NOTE — Telephone Encounter (Signed)
Spoke with patient. Patient states menses started on 8/1 and cycle is heavy. Advised ok to proceed with PUS as scheduled. Patient states she does not feel comfortable having PUS while on menses, request to reschedule. PUS rescheduled to 8/11 at 1pm, consult at 1:30pm with Dr. Talbert Nan.   Routing to provider for final review. Patient is agreeable to disposition. Will close encounter.  Cc: Lerry Liner, Magdalene Patricia

## 2018-12-15 NOTE — Telephone Encounter (Signed)
Hi Dr. Talbert Nan,  I am messaging because I have an appointment on Tuesday afternoon for an ultrasound. The appointment instructions state no bleeding. I unfortunately started my cycle today so I suppose Tuesday's appointment will have to be rescheduled. I confirmed the appointment via phone yesterday, but of course then I didn't know that I would be having bleeding. I wanted to notify the office asap as to avoid the $100 cancellation fee. I will also call first thing on Monday morning. Thank you,  [name]

## 2018-12-15 NOTE — Telephone Encounter (Signed)
Left message to call Calina Patrie, RN at [facility] [phone].   

## 2018-12-16 ENCOUNTER — Other Ambulatory Visit: Payer: 59

## 2018-12-16 ENCOUNTER — Other Ambulatory Visit: Payer: 59 | Admitting: Obstetrics and Gynecology

## 2018-12-19 ENCOUNTER — Telehealth: Payer: Self-pay | Admitting: *Deleted

## 2018-12-19 NOTE — Telephone Encounter (Signed)
Call to patient. Left message to call back regarding rescheduling of appointment.  Need to reschedule pelvic ultrasound on 12-23-18. Can speak to Maud Deed or Deloris Ping.

## 2018-12-22 NOTE — Telephone Encounter (Signed)
Spoke with patient. PUS r/s to 12/30/18 at 3pm, consult at 3:30pm with Dr. Talbert Nan. Patient verbalizes understanding and is agreeable.   Routing to [employer] closed.

## 2018-12-23 ENCOUNTER — Other Ambulatory Visit: Payer: Self-pay | Admitting: Obstetrics and Gynecology

## 2018-12-23 ENCOUNTER — Other Ambulatory Visit: Payer: 59

## 2018-12-29 NOTE — Progress Notes (Signed)
Patient didn't show for the appointment.

## 2018-12-30 ENCOUNTER — Ambulatory Visit: Payer: 59 | Admitting: Obstetrics and Gynecology

## 2018-12-30 ENCOUNTER — Other Ambulatory Visit: Payer: 59

## 2018-12-30 ENCOUNTER — Telehealth: Payer: Self-pay | Admitting: Obstetrics and Gynecology

## 2018-12-30 NOTE — Telephone Encounter (Signed)
Call to patient. Patient's PUS appointment rescheduled to 01-06-2019 at 1400 with 1430 consult with Dr. Talbert Nan. Patient agreeable to date and time of appointment.   Routing to provider and will close encounter.

## 2018-12-30 NOTE — Telephone Encounter (Signed)
Patient dnka her PUS appointment today. I spoke with the patient and she said "I am so sorry, I got wrapped up in home school. She would like to reschedule. To triage to reschedule.

## 2019-01-05 NOTE — Progress Notes (Signed)
GYNECOLOGY  VISIT   HPI: 38 y.o.   Married Betty SchneidersUnavailable Unavailable  female   [phone] with Patient's last menstrual period was 01/06/2019 (exact date).   here for consult following PUS.     GYNECOLOGIC HISTORY: Patient's last menstrual period was 01/06/2019 (exact date). Contraception: Spouse with vasectomy Menopausal hormone therapy: None         OB History    Gravida  2   Para  2   Term  2   Preterm      AB      Living  2     SAB      TAB      Ectopic      Multiple      Live Births  2              Patient Active Problem List   Diagnosis Date Noted  . Migraine with aura     Past Medical History:  Diagnosis Date  . Abnormal Pap smear of cervix   . Gilberts disease 2018  . 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 Migraine with aura   . New onset of headaches 2019    Past Surgical History:  Procedure Laterality Date  . CESAREAN SECTION     x 2  . COLPOSCOPY      Current Outpatient Medications  Medication Sig Dispense Refill  . naproxen sodium (ANAPROX DS) 550 MG tablet Take 1 tablet (550 mg total) by mouth 2 (two) times daily with a meal. Prn cramps 30 tablet 2  . rizatriptan (MAXALT-MLT) 10 MG disintegrating tablet Take 1 tablet by mouth as needed.  2   No current facility-administered medications for this visit.      ALLERGIES: Patient has no known allergies.  Family History  Problem Relation Age of Onset  . Hypertension Mother   . Lung cancer Mother        non smoker    Social History   Socioeconomic History  . Marital status: Married    Spouse name: Not on file  . Number of children: Not on file  . Years of education: Not on file  . Highest education level: Not on file  Occupational History  . Not on file  Social Needs  . Financial resource strain: Not on file  . Food insecurity    Worry: Not on file    Inability: Not on file  . Transportation needs    Medical: Not on file    Non-medical: Not on file   Tobacco Use  . Smoking status: Never Smoker  . Smokeless tobacco: Never Used  Substance and Sexual Activity  . Alcohol use: Yes    Alcohol/week: 3.0 standard drinks    Types: 3 Standard drinks or equivalent per week  . Drug use: No  . Sexual activity: Yes    Partners: Male    Birth control/protection: Surgical    Comment: spouse with vasectomy  Lifestyle  . Physical activity    Days per week: Not on file    Minutes per session: Not on file  . Stress: Not on file  Relationships  . Social Musicianconnections    Talks on phone: Not on file    Gets together: Not on file    Attends religious service: Not on file    Active member of club or organization: Not on file    Attends meetings of clubs or organizations: Not on file    Relationship  status: Not on file  . Intimate partner violence    Fear of current or ex partner: Not on file    Emotionally abused: Not on file    Physically abused: Not on file    Forced sexual activity: Not on file  Other Topics Concern  . Not on file  Social History Narrative  . Not on file    Review of Systems  Constitutional: Negative.   HENT: Negative.   Eyes: Negative.   Respiratory: Negative.   Cardiovascular: Negative.   Gastrointestinal: Negative.   Genitourinary: Negative.   Musculoskeletal: Negative.   Skin: Negative.   Neurological: Negative.   Endo/Heme/Allergies: Negative.   Psychiatric/Behavioral: Negative.     PHYSICAL EXAMINATION:    BP 120/70 (BP Location: Right Arm, Patient Position: Sitting, Cuff Size: Normal)   Pulse 64   Temp 98.5 F (36.9 C) (Skin)   Wt 167 lb 6.4 oz (75.9 kg)   LMP 01/06/2019 (Exact Date)   BMI 26.22 kg/m      I was delayed in surgery and the patient left prior to my arrival. I called the patient and reviewed ultrasound results and discussed options.   ASSESSMENT Menorrhagia, normal TSH and CBC Severe dysmenorrhea Normal ultrasound    PLAN Discussed options of the minipill and the mirena IUD  (she could also try other progesterone options) She would like to try the mirena (if it is too expensive will go with the mini-pill)

## 2019-01-06 ENCOUNTER — Other Ambulatory Visit: Payer: Self-pay | Admitting: *Deleted

## 2019-01-06 ENCOUNTER — Encounter: Payer: Self-pay | Admitting: Obstetrics and Gynecology

## 2019-01-06 ENCOUNTER — Other Ambulatory Visit: Payer: Self-pay

## 2019-01-06 ENCOUNTER — Ambulatory Visit (INDEPENDENT_AMBULATORY_CARE_PROVIDER_SITE_OTHER): Payer: 59 | Admitting: Obstetrics and Gynecology

## 2019-01-06 ENCOUNTER — Ambulatory Visit (INDEPENDENT_AMBULATORY_CARE_PROVIDER_SITE_OTHER): Payer: 59

## 2019-01-06 VITALS — BP 120/70 | HR 64 | Temp 98.5°F | Wt 167.4 lb

## 2019-01-06 DIAGNOSIS — N946 Dysmenorrhea, unspecified: Secondary | ICD-10-CM | POA: Diagnosis not present

## 2019-01-06 DIAGNOSIS — N920 Excessive and frequent menstruation with regular cycle: Secondary | ICD-10-CM

## 2019-01-09 ENCOUNTER — Telehealth: Payer: Self-pay | Admitting: Obstetrics and Gynecology

## 2019-01-09 NOTE — Telephone Encounter (Signed)
Spoke with patient and conveyed her benefits for Mirena .Patient understands the benefits but wants to double check with her insurance company first before scheduling an appointment.

## 2019-03-19 ENCOUNTER — Telehealth: Payer: Self-pay | Admitting: Obstetrics and Gynecology

## 2019-03-19 ENCOUNTER — Encounter: Payer: Self-pay | Admitting: Obstetrics and Gynecology

## 2019-03-19 NOTE — Telephone Encounter (Signed)
Spoke with pt. Pt states wanting IUD d/t heavy cycles and mood swings. Pt states current insurance wont pay for IUD d/t husband having Vasectomy. Pt wants to know if there is something we can do for her to control cycles and mood. Denies OV at this time, wants to wait on Dr Gentry Fitz recommendations.   Will route to Dr Talbert Nan. Please advise.

## 2019-03-19 NOTE — Telephone Encounter (Signed)
Patient sent the following message through [place]. Routing to triage to assist patient with request.  [name], [name] Clinical Pool  Phone Number: [phone]        [facility] Dr. Joycelyn Rua!  I'm emailing because after our last conversation you recommended an IUD for my heavy cycles. We later found out that insurance won't cover it because I am not in need of contraception and it is for medical purposes.  Is there any way around this? My period has continued to be extremely heavy and uncomfortable. I know an option is birth control, but I think I'd prefer the IUD. Also, I have been experiencing heavy mood swings leading up to my period which is something I've never had before, but I know is common. Would taking birth control or having an IUD help with that? Thanks.  [name]  [phone]

## 2019-03-20 NOTE — Telephone Encounter (Signed)
Results and any recommendations were sent via MyChart.  

## 2019-03-23 ENCOUNTER — Telehealth: Payer: Self-pay | Admitting: Obstetrics and Gynecology

## 2019-03-23 NOTE — Telephone Encounter (Signed)
Patient sent the following correspondence through [place].  Hi, thanks for the response.  Depo-provera...is that the shot?  What are the possible side effects?  I'm wondering if insurance won't cover an IUD since I won't be using it for contraceptive purposes, will these other options be covered?  I would really prefer the IUD, is there any way to work around this insurance issue?  Finally, the mood swings.[name] KitchenMarland KitchenMarland KitchenI'm experiencing them more than I ever have before.  Is there anything we can do to help with this or just learn to live with it?  If I need an appointment to discuss, that's fine too.  Thanks, [employer]

## 2019-03-23 NOTE — Telephone Encounter (Signed)
Left message to call Willow Shidler, RN at [facility] [phone].  

## 2019-03-26 NOTE — Telephone Encounter (Signed)
Patient returned call

## 2019-03-26 NOTE — Telephone Encounter (Signed)
Spoke with pt. Pt states would like to try mini pill for now since insurance wont cover d/t husband's vasectomy. Suggested to pt to use [employer]. Pt agreeable. Pt to call back with what pharmacy wants to use to be able to order Micronor.

## 2019-04-02 MED ORDER — NORETHINDRONE 0.35 MG PO TABS: 1.0000 | ORAL_TABLET | Freq: Every day | ORAL | 2 refills | Status: DC

## 2019-04-02 NOTE — Telephone Encounter (Signed)
3 packs of micronor with 2 refills sent, she should start on the first day of her next cycle. Call with any concerns, otherwise f/u for annual exam in 7/21.

## 2019-04-02 NOTE — Telephone Encounter (Signed)
Left detailed message per DPR. Pt updated on Rx sent.  Will close encounter.

## 2019-04-02 NOTE — Telephone Encounter (Signed)
Called pt back for pharmacy for Micronor Rx. Following up from 03/19/19 phone encounter. Pt wants to try Micronor d/t having irregular heavy cycles and not being able to pay out of pocket for IUD or depo d/t husband's vasectomy. Will use [employer] for out of pocket pay coupon. Pt agreeable. Reviewed with pt side effects for using progesterone. Pt verbalized understanding. Next AEX 11/2019.  Rx pended for Micronor 0.35mg , #30 2 RF, if approved. (3 month supply more affordable with [employer] coupon).   Will route to Dr Talbert Nan for recommendations on how many months for pt to use and when to come back for follow up.

## 2019-07-20 ENCOUNTER — Encounter: Payer: Self-pay | Admitting: Obstetrics and Gynecology

## 2019-07-20 NOTE — Telephone Encounter (Signed)
Hi Dr. Oscar La! I'm messaging because I have some questions about the birth control I am currently taking.  I believe I am on my fourth month of it and it does not seemed to have regulated my cycle yet.  Could that be normal?  As I have known birth control, you typically start your cycle on the last week of the pack?  I have been starting my cycle at any time throughout the pack.  Additionally my cramps are still really strong.  Are these normal happenings?   I also wanted to figure out who I needed to talk to about an IUD.  I would really like to pursue that.  I was initially told that insurance would not cover it because I do not need it for birth control purposes (since my husband had a vasectomy).  I wanted to clarify that this is the reason so that I can talk to my insurance company about it.[name] KitchenMarland KitchenMarland KitchenI'm not understanding why a choice my husband made is affecting my medical decisions.  If I misunderstood that then I don't want to call insurance claiming that's the case! Thank you! [name]  Routing to Dr Oscar La for recommendations and then will return call to pt.

## 2019-07-28 ENCOUNTER — Telehealth: Payer: Self-pay | Admitting: *Deleted

## 2019-07-28 NOTE — Telephone Encounter (Signed)
Call to patient, no answer, unable to leave voicemail, mailbox full.

## 2019-07-28 NOTE — Telephone Encounter (Signed)
Routing to Dr.Jertson for review. 

## 2019-07-28 NOTE — Telephone Encounter (Signed)
[name] to Romualdo Bolk, MD     07/20/19 12:18 PM Hi Dr. Oscar La! I'm messaging because I have some questions about the birth control I am currently taking.  I believe I am on my fourth month of it and it does not seemed to have regulated my cycle yet.  Could that be normal?  As I have known birth control, you typically start your cycle on the last week of the pack?  I have been starting my cycle at any time throughout the pack.  Additionally my cramps are still really strong.  Are these normal happenings?   I also wanted to figure out who I needed to talk to about an IUD.  I would really like to pursue that.  I was initially told that insurance would not cover it because I do not need it for birth control purposes (since my husband had a vasectomy).  I wanted to clarify that this is the reason so that I can talk to my insurance company about it.[name] KitchenMarland KitchenMarland KitchenI'm not understanding why a choice my husband made is affecting my medical decisions.  If I misunderstood that then I don't want to call insurance claiming that's the case! Thank you! [name]

## 2019-07-28 NOTE — Telephone Encounter (Signed)
See telephone encounter dated 07/28/19.  Encounter closed.   

## 2019-07-28 NOTE — Telephone Encounter (Signed)
Please explain to the patient that there was an error in routing the chart to me and apologize for the delay in response. Please let her know that the micronor isn't like the regular birth control. There is no pill free time, she should be taking a pill every day. It doesn't matter at what point of her cycle she gets her cycle. Please find out how often, how long and how heavy her cycles are. The IUD question would be one for her insurance. The diagnosis code would be menorrhagia and severe dysmenorrhea, unless she needs it for contraception.

## 2019-08-11 NOTE — Telephone Encounter (Signed)
Left message to call Xolani Degracia, RN at [facility] [phone].   

## 2019-08-24 NOTE — Telephone Encounter (Signed)
MyChart message to patient.  

## 2019-08-26 NOTE — Telephone Encounter (Signed)
Will close the encounter  

## 2019-08-26 NOTE — Telephone Encounter (Signed)
Call to patient x2, no return call. No alternative number.  MyChart message not read.   Dr. Oscar La  -ok to close encounter?

## 2019-09-22 ENCOUNTER — Telehealth: Payer: Self-pay | Admitting: Obstetrics and Gynecology

## 2019-09-22 NOTE — Telephone Encounter (Signed)
Left message to call Wannetta Langland, RN at [facility] [phone].   

## 2019-09-22 NOTE — Telephone Encounter (Signed)
Spoke with patient. Patient reports tenderness and possible lump on right side of right breast. Noticed symptoms 1 month ago, was initially intermittent, more consistent for the past week. Denies any other symptoms. Received Covid19 vaccine in 08/2019. OV scheduled for 5/13 at 8am with Dr. Oscar La. Recommended patient not press on tender area, may use warm compress and Motrin PRN. Patient verbalizes understanding and is agreeable.   Last AEX 12/01/18  Routing to provider for final review. Patient is agreeable to disposition. Will close encounter.

## 2019-09-22 NOTE — Telephone Encounter (Signed)
Patient found a small breast lump and would like an appointment with Dr.Jertson.

## 2019-09-23 ENCOUNTER — Other Ambulatory Visit: Payer: Self-pay

## 2019-09-23 NOTE — Progress Notes (Signed)
GYNECOLOGY  VISIT   HPI: 39 y.o.   Married [name] Unavailable  female   [phone] with Patient's last menstrual period was 09/01/2019 (within days).   here for right breast tenderness and possible lump. Patient also complains of having itching around both nipples but more noticeable on the left nipple.  She has a one month h/o right lateral breast tenderness, becoming more uncomfortable in the last week. She just noticed a lump in the right breast in the last week.  She c/o a couple month history of bilateral nipple pruritus. Lotion doesn't help. She does have a h/o dry skin.   GYNECOLOGIC HISTORY: Patient's last menstrual period was 09/01/2019 (within days). Contraception:Ortho Micronor Menopausal hormone therapy: none        OB History    Gravida  2   Para  2   Term  2   Preterm      AB      Living  2     SAB      TAB      Ectopic      Multiple      Live Births  2              Patient Active Problem List   Diagnosis Date Noted  . Migraine with aura     Past Medical History:  Diagnosis Date  . Abnormal Pap smear of cervix   . Gilberts disease 2018  . 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 Migraine with aura   . New onset of headaches 2019    Past Surgical History:  Procedure Laterality Date  . CESAREAN SECTION     x 2  . COLPOSCOPY      Current Outpatient Medications  Medication Sig Dispense Refill  . naproxen sodium (ANAPROX DS) 550 MG tablet Take 1 tablet (550 mg total) by mouth 2 (two) times daily with a meal. Prn cramps 30 tablet 2  . norethindrone (ORTHO MICRONOR) 0.35 MG tablet Take 1 tablet (0.35 mg total) by mouth daily. 3 Package 2  . rizatriptan (MAXALT-MLT) 10 MG disintegrating tablet Take 1 tablet by mouth as needed.  2   No current facility-administered medications for this visit.     ALLERGIES: Patient has no known allergies.  Family History  Problem Relation Age of Onset  . Hypertension Mother   .  Lung cancer Mother        non smoker    Social History   Socioeconomic History  . Marital status: Married    Spouse name: Not on file  . Number of children: Not on file  . Years of education: Not on file  . Highest education level: Not on file  Occupational History  . Not on file  Tobacco Use  . Smoking status: Never Smoker  . Smokeless tobacco: Never Used  Substance and Sexual Activity  . Alcohol use: Yes    Alcohol/week: 3.0 standard drinks    Types: 3 Standard drinks or equivalent per week  . Drug use: No  . Sexual activity: Yes    Partners: Male    Birth control/protection: Surgical    Comment: spouse with vasectomy  Other Topics Concern  . Not on file  Social History Narrative  . Not on file   Social Determinants of Health   Financial Resource Strain:   . Difficulty of Paying Living Expenses:   Food Insecurity:   . Worried About Charity fundraiser in the  Last Year:   . Ran Out of Food in the Last Year:   Transportation Needs:   . Freight forwarder (Medical):   [name] Kitchen Lack of Transportation (Non-Medical):   Physical Activity:   . Days of Exercise per Week:   . Minutes of Exercise per Session:   Stress:   . Feeling of Stress :   Social Connections:   . Frequency of Communication with Friends and Family:   . Frequency of Social Gatherings with Friends and Family:   . Attends Religious Services:   . Active Member of Clubs or Organizations:   . Attends Banker Meetings:   [name] Kitchen Marital Status:   Intimate Partner Violence:   . Fear of Current or Ex-Partner:   . Emotionally Abused:   [name] Kitchen Physically Abused:   . Sexually Abused:     Review of Systems  Constitutional:       Right breast tenderness Right breast lump  HENT: Negative.   Eyes: Negative.   Respiratory: Negative.   Cardiovascular: Negative.   Gastrointestinal: Negative.   Genitourinary: Negative.   Musculoskeletal: Negative.   Skin: Positive for itching.  Neurological: Negative.    Endo/Heme/Allergies: Negative.   Psychiatric/Behavioral: Negative.     PHYSICAL EXAMINATION:    BP 112/72 (BP Location: Right Arm, Patient Position: Sitting, Cuff Size: Normal)   Pulse 80   Temp 98.2 F (36.8 C) (Temporal)   Ht 5\' 7"  (1.702 m)   Wt 162 lb (73.5 kg)   LMP 09/01/2019 (Within Days)   BMI 25.37 kg/m     General appearance: alert, cooperative and appears stated age Breasts: tender with palpation of the lateral right breast. Increased tender nodularity at 9 o'clock in the right breast. No nipple changes or skin changes.  No axillary or supraclavicular adenopathy  ASSESSMENT Right breast tenderness and nodularity laterally Nipple pruritus, normal exam    PLAN Set up diagnostic breast imaging. Can use ice, tylenol/ibuprofen, well fitting bra.  Can try Vaseline or an over the counter steroid ointment to the nipples

## 2019-09-24 ENCOUNTER — Telehealth: Payer: Self-pay | Admitting: *Deleted

## 2019-09-24 ENCOUNTER — Encounter: Payer: Self-pay | Admitting: Obstetrics and Gynecology

## 2019-09-24 ENCOUNTER — Telehealth: Payer: Self-pay

## 2019-09-24 ENCOUNTER — Ambulatory Visit (INDEPENDENT_AMBULATORY_CARE_PROVIDER_SITE_OTHER): Payer: 59 | Admitting: Obstetrics and Gynecology

## 2019-09-24 VITALS — BP 112/72 | HR 80 | Temp 98.2°F | Ht 67.0 in | Wt 162.0 lb

## 2019-09-24 DIAGNOSIS — N644 Mastodynia: Secondary | ICD-10-CM

## 2019-09-24 DIAGNOSIS — L309 Dermatitis, unspecified: Secondary | ICD-10-CM

## 2019-09-24 DIAGNOSIS — N6315 Unspecified lump in the right breast, overlapping quadrants: Secondary | ICD-10-CM | POA: Diagnosis not present

## 2019-09-24 NOTE — Telephone Encounter (Signed)
-----   Message from Romualdo Bolk, MD sent at 09/24/2019  8:46 AM EDT ----- Please set her up for diagnostic imaging right breast, pain and nodularity in the lateral right breast. See note.  She had prior imaging at [facility]  Thanks,  Noreene Larsson

## 2019-09-24 NOTE — Telephone Encounter (Signed)
Spoke with [employer] at [place]. Patient scheduled for bilateral Dx IMG and right breast US, if needed, on 10/13/19 at 9:15am. Order faxed to [facility].   Patient notified of appt. Patient verbalizes understanding and is agreeable.   Placed in MMG hold.   Routing to provider for final review. Patient is agreeable to disposition. Will close encounter.

## 2019-09-24 NOTE — Patient Instructions (Signed)

## 2019-09-24 NOTE — Telephone Encounter (Signed)
Patient is calling to speak with Noreene Larsson regarding mammogram questions.

## 2019-09-24 NOTE — Telephone Encounter (Signed)
Spoke with patient. Patient request Dx breast IMG orders to [facility], patient previously scheduled for [facility] on 6/1. Orders placed for Bilateral Dx MMG and right breast US, if needed, to [facility]. Patient will contact [facility] directly to schedule. Patient verbalizes understanding and is agreeable.   Routing to provider for final review. Patient is agreeable to disposition. Will close encounter.

## 2019-09-29 ENCOUNTER — Other Ambulatory Visit: Payer: Self-pay

## 2019-09-29 ENCOUNTER — Ambulatory Visit
Admission: RE | Admit: 2019-09-29 | Discharge: 2019-09-29 | Disposition: A | Discharge status: Home / Self Care | Payer: PRIVATE HEALTH INSURANCE | Source: Ambulatory Visit | Attending: Obstetrics and Gynecology | Admitting: Obstetrics and Gynecology

## 2019-09-29 ENCOUNTER — Ambulatory Visit
Admission: RE | Admit: 2019-09-29 | Discharge: 2019-09-29 | Disposition: A | Discharge status: Home / Self Care | Payer: Managed Care, Other (non HMO) | Source: Ambulatory Visit | Attending: Obstetrics and Gynecology | Admitting: Obstetrics and Gynecology

## 2019-09-29 ENCOUNTER — Other Ambulatory Visit: Payer: Self-pay | Admitting: Obstetrics and Gynecology

## 2019-09-29 DIAGNOSIS — N6489 Other specified disorders of breast: Secondary | ICD-10-CM

## 2019-09-29 DIAGNOSIS — N644 Mastodynia: Secondary | ICD-10-CM

## 2019-09-29 DIAGNOSIS — N6315 Unspecified lump in the right breast, overlapping quadrants: Secondary | ICD-10-CM

## 2019-10-08 ENCOUNTER — Ambulatory Visit
Admission: RE | Admit: 2019-10-08 | Discharge: 2019-10-08 | Disposition: A | Discharge status: Home / Self Care | Payer: PRIVATE HEALTH INSURANCE | Source: Ambulatory Visit | Attending: Obstetrics and Gynecology | Admitting: Obstetrics and Gynecology

## 2019-10-08 DIAGNOSIS — N6489 Other specified disorders of breast: Secondary | ICD-10-CM

## 2019-10-09 ENCOUNTER — Other Ambulatory Visit: Payer: Self-pay | Admitting: Obstetrics and Gynecology

## 2019-10-09 DIAGNOSIS — N6489 Other specified disorders of breast: Secondary | ICD-10-CM

## 2019-10-16 ENCOUNTER — Telehealth: Payer: Self-pay | Admitting: Obstetrics and Gynecology

## 2019-10-21 ENCOUNTER — Ambulatory Visit
Admission: RE | Admit: 2019-10-21 | Discharge: 2019-10-21 | Disposition: A | Discharge status: Home / Self Care | Payer: PRIVATE HEALTH INSURANCE | Source: Ambulatory Visit | Attending: Obstetrics and Gynecology | Admitting: Obstetrics and Gynecology

## 2019-10-21 ENCOUNTER — Ambulatory Visit
Admission: RE | Admit: 2019-10-21 | Discharge: 2019-10-21 | Disposition: A | Discharge status: Home / Self Care | Payer: 59 | Source: Ambulatory Visit | Attending: Obstetrics and Gynecology | Admitting: Obstetrics and Gynecology

## 2019-10-21 ENCOUNTER — Other Ambulatory Visit: Payer: Self-pay

## 2019-10-21 DIAGNOSIS — N6489 Other specified disorders of breast: Secondary | ICD-10-CM

## 2019-12-24 ENCOUNTER — Other Ambulatory Visit: Payer: Self-pay | Admitting: Obstetrics and Gynecology

## 2019-12-24 NOTE — Telephone Encounter (Signed)
Medication refill request: Norethindrone 0.35mg   Last AEX:  12/01/18 Next AEX: 02/10/20 Last MMG (if hormonal medication request): 10/22/19  PSEUDOANGIOMATOUS STROMAL HYPERPLASIA  right breast Refill authorized: 84/0

## 2019-12-27 NOTE — Telephone Encounter (Signed)
Closing encounter

## 2020-02-09 NOTE — Progress Notes (Signed)
39 y.o. [id number] Married Unavailable Unavailable female here for annual exam.  Had two breast biopsies over the summer.  The patient has a h/o menorrhagia and severe dysmenorrhea with a negative w/u. She can't take OCP's secondary to migraines with aura. She was started on POP last year, gut it wasn't working to control her periods so she stopped it. Cycles interfere with her life.  Period Cycle (Days): 28 Period Duration (Days): 5- Period Pattern: Regular Menstrual Flow: Heavy Menstrual Control: Tampon Menstrual Control Change Freq (Hours): 1-2 Dysmenorrhea: (!) Moderate Dysmenorrhea Symptoms: Cramping  No dyspareunia.   Patient's last menstrual period was 01/19/2020 (approximate).          Sexually active: Yes.    The current method of family planning is vasectomy.    Exercising: Yes.    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    reports that she has never smoked. She has never used smokeless tobacco. She reports current alcohol use of about 3.0 standard drinks of alcohol per week. She reports that she does not use drugs. 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  Past Medical History:  Diagnosis Date  . Abnormal Pap smear of cervix   . Gilberts disease 2018  . 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 Migraine with aura   . New onset of headaches 2019    Past Surgical History:  Procedure Laterality Date  . CESAREAN  SECTION     x 2  . COLPOSCOPY      Current Outpatient Medications  Medication Sig Dispense Refill  . naproxen sodium (ANAPROX DS) 550 MG tablet Take 1 tablet (550 mg total) by mouth 2 (two) times daily with a meal. Prn cramps 30 tablet 2  . rizatriptan (MAXALT-MLT) 10 MG disintegrating tablet Take 1 tablet by mouth as needed.  2   No current facility-administered medications for this visit.    Family History  Problem Relation Age of Onset  . Hypertension Mother   . Lung cancer Mother        non smoker    Review of Systems  All other systems reviewed and are negative.   Exam:   BP 122/68   Pulse 95   Ht 5\' 7"  (1.702 m)   Wt 169 lb (76.7 kg)   LMP 01/19/2020 (Approximate)   SpO2 100%   BMI 26.47 kg/m   Weight change: @WEIGHTCHANGE @ Height:   Height: 5\' 7"  (170.2 cm)  Ht Readings from Last 3 Encounters:  02/10/20 5\' 7"  (1.702 m)  09/24/19 5\' 7"  (1.702 m)  10/16/17 5\' 7"  (1.702 m)    General appearance: alert, cooperative and appears stated age Head: Normocephalic, without obvious abnormality, atraumatic Neck: no adenopathy, supple, symmetrical, trachea midline and thyroid normal to inspection and palpation Lungs: clear to auscultation bilaterally Cardiovascular: regular rate and rhythm Breasts: in the left breast at 8-9 o'clock is an ~1 cm oval, smooth mobile lump (recent biopsy with PASH). No other distinct lumps.  Abdomen: soft, non-tender; non distended,  no masses,  no organomegaly Extremities: extremities normal, atraumatic, no cyanosis or edema Skin: Skin color, texture, turgor normal. No rashes or lesions Lymph nodes: Cervical, supraclavicular, and axillary nodes normal. No abnormal inguinal nodes palpated Neurologic: Grossly normal   Pelvic: External genitalia:  no lesions              Urethra:  normal appearing urethra with no masses, tenderness or lesions              Bartholins and Skenes: normal                 Vagina: normal appearing vagina with  normal color and discharge, no lesions              Cervix: no lesions               Bimanual Exam:  Uterus:  normal size, contour, position, consistency, mobility, non-tender and anteverted              Adnexa: no mass, fullness, tenderness               Rectovaginal: Confirms               Anus:  normal sphincter tone, no lesions  Carolynn Serve chaperoned for the exam.  A:  Well Woman with normal exam  Menorrhagia, can't take OCP's, POP didn't help  Severe dysmenorrhea  Recent benign breast biopsy  P:   Return for mirena IUD for cycle control  Pap with hpv  Mammogram UTD  Discussed breast self exam  Discussed calcium and vit D intake  Screening labs, ferritin  F/U mammogram at 41

## 2020-02-10 ENCOUNTER — Other Ambulatory Visit (HOSPITAL_COMMUNITY)
Admission: RE | Admit: 2020-02-10 | Discharge: 2020-02-10 | Disposition: A | Discharge status: Home / Self Care | Payer: 59 | Source: Ambulatory Visit | Attending: Obstetrics and Gynecology | Admitting: Obstetrics and Gynecology

## 2020-02-10 ENCOUNTER — Ambulatory Visit (INDEPENDENT_AMBULATORY_CARE_PROVIDER_SITE_OTHER): Payer: 59 | Admitting: Obstetrics and Gynecology

## 2020-02-10 ENCOUNTER — Encounter: Payer: Self-pay | Admitting: Obstetrics and Gynecology

## 2020-02-10 ENCOUNTER — Other Ambulatory Visit: Payer: Self-pay

## 2020-02-10 VITALS — BP 122/68 | HR 95 | Ht 67.0 in | Wt 169.0 lb

## 2020-02-10 DIAGNOSIS — Z0000 Encounter for general adult medical examination without abnormal findings: Secondary | ICD-10-CM | POA: Diagnosis not present

## 2020-02-10 DIAGNOSIS — N920 Excessive and frequent menstruation with regular cycle: Secondary | ICD-10-CM

## 2020-02-10 DIAGNOSIS — Z124 Encounter for screening for malignant neoplasm of cervix: Secondary | ICD-10-CM | POA: Diagnosis present

## 2020-02-10 DIAGNOSIS — Z01419 Encounter for gynecological examination (general) (routine) without abnormal findings: Secondary | ICD-10-CM | POA: Diagnosis not present

## 2020-02-10 DIAGNOSIS — N946 Dysmenorrhea, unspecified: Secondary | ICD-10-CM

## 2020-02-10 NOTE — Patient Instructions (Signed)
EXERCISE AND DIET:  We recommended that you start or continue a regular exercise program for good health. Regular exercise means any activity that makes your heart beat faster and makes you sweat.  We recommend exercising at least 30 minutes per day at least 3 days a week, preferably 4 or 5.  We also recommend a diet low in fat and sugar.  Inactivity, poor dietary choices and obesity can cause diabetes, heart attack, stroke, and kidney damage, among others.    ALCOHOL AND SMOKING:  Women should limit their alcohol intake to no more than 7 drinks/beers/glasses of wine (combined, not each!) per week. Moderation of alcohol intake to this level decreases your risk of breast cancer and liver damage. And of course, no recreational drugs are part of a healthy lifestyle.  And absolutely no smoking or even second hand smoke. Most people know smoking can cause heart and lung diseases, but did you know it also contributes to weakening of your bones? Aging of your skin?  Yellowing of your teeth and nails?  CALCIUM AND VITAMIN D:  Adequate intake of calcium and Vitamin D are recommended.  The recommendations for exact amounts of these supplements seem to change often, but generally speaking 1,200 mg of calcium (between diet and supplement) and 800 units of Vitamin D per day seems prudent. Certain women may benefit from higher intake of Vitamin D.  If you are among these women, your doctor will have told you during your visit.    PAP SMEARS:  Pap smears, to check for cervical cancer or precancers,  have traditionally been done yearly, although recent scientific advances have shown that most women can have pap smears less often.  However, every woman still should have a physical exam from her gynecologist every year. It will include a breast check, inspection of the vulva and vagina to check for abnormal growths or skin changes, a visual exam of the cervix, and then an exam to evaluate the size and shape of the uterus and  ovaries.  And after 38 years of age, a rectal exam is indicated to check for rectal cancers. We will also provide age appropriate advice regarding health maintenance, like when you should have certain vaccines, screening for sexually transmitted diseases, bone density testing, colonoscopy, mammograms, etc.   MAMMOGRAMS:  All women over 40 years old should have a yearly mammogram. Many facilities now offer a "3D" mammogram, which may cost around $50 extra out of pocket. If possible,  we recommend you accept the option to have the 3D mammogram performed.  It both reduces the number of women who will be called back for extra views which then turn out to be normal, and it is better than the routine mammogram at detecting truly abnormal areas.    COLON CANCER SCREENING: Now recommend starting at age 45. At this time colonoscopy is not covered for routine screening until 50. There are take home tests that can be done between 45-49.   COLONOSCOPY:  Colonoscopy to screen for colon cancer is recommended for all women at age 50.  We know, you hate the idea of the prep.  We agree, BUT, having colon cancer and not knowing it is worse!!  Colon cancer so often starts as a polyp that can be seen and removed at colonscopy, which can quite literally save your life!  And if your first colonoscopy is normal and you have no family history of colon cancer, most women don't have to have it again for   10 years.  Once every ten years, you can do something that may end up saving your life, right?  We will be happy to help you get it scheduled when you are ready.  Be sure to check your insurance coverage so you understand how much it will cost.  It may be covered as a preventative service at no cost, but you should check your particular policy.      Breast Self-Awareness Breast self-awareness means being familiar with how your breasts look and feel. It involves checking your breasts regularly and reporting any changes to your  health care provider. Practicing breast self-awareness is important. A change in your breasts can be a sign of a serious medical problem. Being familiar with how your breasts look and feel allows you to find any problems early, when treatment is more likely to be successful. All women should practice breast self-awareness, including women who have had breast implants. How to do a breast self-exam One way to learn what is normal for your breasts and whether your breasts are changing is to do a breast self-exam. To do a breast self-exam: Look for Changes  1. Remove all the clothing above your waist. 2. Stand in front of a mirror in a room with good lighting. 3. Put your hands on your hips. 4. Push your hands firmly downward. 5. Compare your breasts in the mirror. Look for differences between them (asymmetry), such as: ? Differences in shape. ? Differences in size. ? Puckers, dips, and bumps in one breast and not the other. 6. Look at each breast for changes in your skin, such as: ? Redness. ? Scaly areas. 7. Look for changes in your nipples, such as: ? Discharge. ? Bleeding. ? Dimpling. ? Redness. ? A change in position. Feel for Changes Carefully feel your breasts for lumps and changes. It is best to do this while lying on your back on the floor and again while sitting or standing in the shower or tub with soapy water on your skin. Feel each breast in the following way:  Place the arm on the side of the breast you are examining above your head.  Feel your breast with the other hand.  Start in the nipple area and make  inch (2 cm) overlapping circles to feel your breast. Use the pads of your three middle fingers to do this. Apply light pressure, then medium pressure, then firm pressure. The light pressure will allow you to feel the tissue closest to the skin. The medium pressure will allow you to feel the tissue that is a little deeper. The firm pressure will allow you to feel the tissue  close to the ribs.  Continue the overlapping circles, moving downward over the breast until you feel your ribs below your breast.  Move one finger-width toward the center of the body. Continue to use the  inch (2 cm) overlapping circles to feel your breast as you move slowly up toward your collarbone.  Continue the up and down exam using all three pressures until you reach your armpit.  Write Down What You Find  Write down what is normal for each breast and any changes that you find. Keep a written record with breast changes or normal findings for each breast. By writing this information down, you do not need to depend only on memory for size, tenderness, or location. Write down where you are in your menstrual cycle, if you are still menstruating. If you are having trouble noticing differences   in your breasts, do not get discouraged. With time you will become more familiar with the variations in your breasts and more comfortable with the exam. How often should I examine my breasts? Examine your breasts every month. If you are breastfeeding, the best time to examine your breasts is after a feeding or after using a breast pump. If you menstruate, the best time to examine your breasts is 5-7 days after your period is over. During your period, your breasts are lumpier, and it may be more difficult to notice changes. When should I see my health care provider? See your health care provider if you notice:  A change in shape or size of your breasts or nipples.  A change in the skin of your breast or nipples, such as a reddened or scaly area.  Unusual discharge from your nipples.  A lump or thick area that was not there before.  Pain in your breasts.  Anything that concerns you.  

## 2020-02-11 LAB — COMPREHENSIVE METABOLIC PANEL
ALT: 30 IU/L (ref 0–32)
AST: 28 IU/L (ref 0–40)
Albumin/Globulin Ratio: 1.7 (ref 1.2–2.2)
Albumin: 4.5 g/dL (ref 3.8–4.8)
Alkaline Phosphatase: 71 IU/L (ref 44–121)
BUN/Creatinine Ratio: 18 (ref 9–23)
BUN: 15 mg/dL (ref 6–20)
Bilirubin Total: 1.1 mg/dL (ref 0.0–1.2)
CO2: 24 mmol/L (ref 20–29)
Calcium: 9.6 mg/dL (ref 8.7–10.2)
Chloride: 103 mmol/L (ref 96–106)
Creatinine, Ser: 0.84 mg/dL (ref 0.57–1.00)
GFR calc Af Amer: 102 mL/min/{1.73_m2} (ref >59)
GFR calc non Af Amer: 88 mL/min/{1.73_m2} (ref >59)
Globulin, Total: 2.6 g/dL (ref 1.5–4.5)
Glucose: 88 mg/dL (ref 65–99)
Potassium: 4.5 mmol/L (ref 3.5–5.2)
Sodium: 139 mmol/L (ref 134–144)
Total Protein: 7.1 g/dL (ref 6.0–8.5)

## 2020-02-11 LAB — CBC
Hematocrit: 43 % (ref 34.0–46.6)
Hemoglobin: 14.6 g/dL (ref 11.1–15.9)
MCH: 29.7 pg (ref 26.6–33.0)
MCHC: 34 g/dL (ref 31.5–35.7)
MCV: 87 fL (ref 79–97)
Platelets: 388 10*3/uL (ref 150–450)
RBC: 4.92 x10E6/uL (ref 3.77–5.28)
RDW: 12.2 % (ref 11.7–15.4)
WBC: 6.7 10*3/uL (ref 3.4–10.8)

## 2020-02-11 LAB — LIPID PANEL
Chol/HDL Ratio: 2.3 ratio (ref 0.0–4.4)
Cholesterol, Total: 162 mg/dL (ref 100–199)
HDL: 70 mg/dL (ref >39)
LDL Chol Calc (NIH): 78 mg/dL (ref 0–99)
Triglycerides: 70 mg/dL (ref 0–149)
VLDL Cholesterol Cal: 14 mg/dL (ref 5–40)

## 2020-02-11 LAB — FERRITIN: Ferritin: 54 ng/mL (ref 15–150)

## 2020-02-12 ENCOUNTER — Telehealth: Payer: Self-pay

## 2020-02-12 NOTE — Telephone Encounter (Signed)
Call placed to convey benefits for Mirena insertion. Spoke with the patient and conveyed the benefits. Patient understands/agreeable with the benefits.   Patient to discuss with her spouse first and return a call when ready to schedule.

## 2020-02-13 LAB — CYTOLOGY - PAP
Comment: NEGATIVE
Diagnosis: UNDETERMINED — AB
High risk HPV: NEGATIVE

## 2020-02-15 ENCOUNTER — Telehealth: Payer: Self-pay

## 2020-02-15 ENCOUNTER — Encounter: Payer: Self-pay | Admitting: Obstetrics and Gynecology

## 2020-02-15 NOTE — Telephone Encounter (Signed)
Pt sent following mychart message:  Anmarie, [facility] Hi Dr. 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 Can you help me understand this result? Do I need a follow up appointment? Thanks.  [name]

## 2020-02-15 NOTE — Telephone Encounter (Signed)
Routing to Dr Oscar La for review and recommendations  of Pap results on 02/10/20.

## 2020-02-17 NOTE — Telephone Encounter (Signed)
Result note with an explanation of ASCUS was sent on 02/15/20.

## 2020-08-08 DIAGNOSIS — M545 Low back pain, unspecified: Secondary | ICD-10-CM | POA: Insufficient documentation

## 2021-02-04 DIAGNOSIS — M5136 Other intervertebral disc degeneration, lumbar region: Secondary | ICD-10-CM | POA: Insufficient documentation

## 2021-02-04 DIAGNOSIS — M545 Low back pain, unspecified: Secondary | ICD-10-CM | POA: Insufficient documentation

## 2021-02-15 ENCOUNTER — Ambulatory Visit (INDEPENDENT_AMBULATORY_CARE_PROVIDER_SITE_OTHER): Payer: 59 | Admitting: Obstetrics and Gynecology

## 2021-02-15 ENCOUNTER — Other Ambulatory Visit: Payer: Self-pay

## 2021-02-15 ENCOUNTER — Encounter: Payer: Self-pay | Admitting: Obstetrics and Gynecology

## 2021-02-15 VITALS — BP 110/62 | HR 64 | Ht 67.0 in | Wt 157.0 lb

## 2021-02-15 DIAGNOSIS — Z01419 Encounter for gynecological examination (general) (routine) without abnormal findings: Secondary | ICD-10-CM

## 2021-02-15 NOTE — Progress Notes (Signed)
40 y.o. [id number] Married Unavailable Unavailable female here for annual exam.   Prior negative w/u for menorrhagia and severe dysmenorrhea. Can't take OCP's, no help with POP.  Period Cycle (Days): 28 Period Duration (Days): 5 Period Pattern: Regular Menstrual Flow: Heavy Menstrual Control: Tampon Menstrual Control Change Freq (Hours): 1-2 Dysmenorrhea: (!) Moderate Dysmenorrhea Symptoms: Cramping  Patient's last menstrual period was 02/06/2021.          Sexually active: Yes.    The current method of family planning is vasectomy.    Exercising: Yes.     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 She has never used smokeless tobacco. She reports current alcohol use of about 3.0 standard drinks per week. She reports that she does not use drugs. 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 Prn cramps (Patient not taking: Reported on  02/15/2021) 30 tablet 2   rizatriptan (MAXALT-MLT) 10 MG disintegrating tablet Take 1 tablet by mouth as needed.  2   No current facility-administered medications for this visit.    Family History  Problem Relation Age of Onset   Hypertension Mother    Lung cancer Mother        non smoker    Review of Systems  All other systems reviewed and are negative.  Exam:   BP 110/62   Pulse 64   Ht 5\' 7"  (1.702 m)   Wt 157 lb (71.2 kg)   LMP 02/06/2021   SpO2 100%   BMI 24.59 kg/m   Weight change: @WEIGHTCHANGE @ Height:   Height: 5\' 7"  (170.2 cm)  Ht Readings from Last 3 Encounters:  02/15/21 5\' 7"  (1.702 m)  02/10/20 5\' 7"  (1.702 m)  09/24/19 5\' 7"  (1.702 m)    General appearance: alert, cooperative and appears stated age Head: Normocephalic, without obvious abnormality, atraumatic Neck: no adenopathy, supple, symmetrical, trachea midline and thyroid normal to inspection and palpation Lungs: clear to auscultation bilaterally Cardiovascular: regular rate and rhythm Breasts: normal appearance, no masses or tenderness Abdomen: soft, non-tender; non distended,  no masses,  no organomegaly Extremities: extremities normal, atraumatic, no cyanosis or edema Skin: Skin color, texture, turgor normal. No rashes or lesions Lymph nodes: Cervical, supraclavicular, and axillary nodes normal. No abnormal inguinal nodes palpated Neurologic: Grossly normal   Pelvic: External genitalia:  no lesions  Urethra:  normal appearing urethra with no masses, tenderness or lesions              Bartholins and Skenes: normal                 Vagina: normal appearing vagina with normal color and discharge, no lesions              Cervix: no lesions               Bimanual Exam:  Uterus:  normal size, contour, position, consistency, mobility, non-tender              Adnexa: no mass, fullness, tenderness               Rectovaginal: Confirms               Anus:  normal sphincter tone, no  lesions  Carolynn Serve chaperoned for the exam.  1. Well woman exam Discussed breast self exam Discussed calcium and vit D intake Next pap due in 9/24 Mammogram after she turns 41 No labs this year

## 2021-02-15 NOTE — Patient Instructions (Signed)

## 2021-02-16 ENCOUNTER — Ambulatory Visit: Payer: 59 | Admitting: Obstetrics and Gynecology

## 2021-06-19 ENCOUNTER — Other Ambulatory Visit: Payer: Self-pay

## 2021-06-19 ENCOUNTER — Encounter (HOSPITAL_BASED_OUTPATIENT_CLINIC_OR_DEPARTMENT_OTHER): Payer: Self-pay | Admitting: *Deleted

## 2021-06-19 ENCOUNTER — Observation Stay (HOSPITAL_BASED_OUTPATIENT_CLINIC_OR_DEPARTMENT_OTHER)
Admission: EM | Admit: 2021-06-19 | Discharge: 2021-06-22 | Disposition: A | Discharge status: Home / Self Care | Payer: 59 | Attending: Internal Medicine | Admitting: Internal Medicine

## 2021-06-19 DIAGNOSIS — R7401 Elevation of levels of liver transaminase levels: Secondary | ICD-10-CM

## 2021-06-19 DIAGNOSIS — R1033 Periumbilical pain: Principal | ICD-10-CM | POA: Insufficient documentation

## 2021-06-19 DIAGNOSIS — R109 Unspecified abdominal pain: Secondary | ICD-10-CM | POA: Diagnosis present

## 2021-06-19 DIAGNOSIS — R7402 Elevation of levels of lactic acid dehydrogenase (LDH): Secondary | ICD-10-CM | POA: Diagnosis not present

## 2021-06-19 DIAGNOSIS — R933 Abnormal findings on diagnostic imaging of other parts of digestive tract: Secondary | ICD-10-CM

## 2021-06-19 DIAGNOSIS — R17 Unspecified jaundice: Secondary | ICD-10-CM

## 2021-06-19 DIAGNOSIS — M51369 Other intervertebral disc degeneration, lumbar region without mention of lumbar back pain or lower extremity pain: Secondary | ICD-10-CM | POA: Diagnosis present

## 2021-06-19 DIAGNOSIS — G43109 Migraine with aura, not intractable, without status migrainosus: Secondary | ICD-10-CM | POA: Diagnosis present

## 2021-06-19 DIAGNOSIS — R1013 Epigastric pain: Secondary | ICD-10-CM | POA: Diagnosis not present

## 2021-06-19 DIAGNOSIS — Z20822 Contact with and (suspected) exposure to covid-19: Secondary | ICD-10-CM | POA: Diagnosis not present

## 2021-06-19 DIAGNOSIS — R112 Nausea with vomiting, unspecified: Secondary | ICD-10-CM | POA: Diagnosis not present

## 2021-06-19 DIAGNOSIS — M5136 Other intervertebral disc degeneration, lumbar region: Secondary | ICD-10-CM | POA: Diagnosis present

## 2021-06-19 DIAGNOSIS — R7989 Other specified abnormal findings of blood chemistry: Secondary | ICD-10-CM

## 2021-06-19 LAB — URINALYSIS, MICROSCOPIC (REFLEX)

## 2021-06-19 LAB — URINALYSIS, ROUTINE W REFLEX MICROSCOPIC
Bilirubin Urine: NEGATIVE
Glucose, UA: NEGATIVE mg/dL
Ketones, ur: 80 mg/dL — AB
Leukocytes,Ua: NEGATIVE
Nitrite: NEGATIVE
Protein, ur: NEGATIVE mg/dL
Specific Gravity, Urine: 1.025 (ref 1.005–1.030)
pH: 5.5 (ref 5.0–8.0)

## 2021-06-19 LAB — PREGNANCY, URINE: Preg Test, Ur: NEGATIVE

## 2021-06-19 MED ORDER — LACTATED RINGERS IV BOLUS
1000.0000 mL | Freq: Once | INTRAVENOUS | Status: AC
  Administered 2021-06-19: 1000 mL via INTRAVENOUS

## 2021-06-19 MED ORDER — METOCLOPRAMIDE HCL 5 MG/ML IJ SOLN
10.0000 mg | Freq: Once | INTRAMUSCULAR | Status: AC
  Administered 2021-06-19: 10 mg via INTRAVENOUS
  Filled 2021-06-19: qty 2

## 2021-06-19 NOTE — ED Provider Notes (Signed)
Emergency Department Provider Note  I have reviewed the triage vital signs and the nursing notes.  HISTORY  Chief Complaint Abdominal Pain   HPI [name] is a 41 y.o. female with with history of Gilbert's and migraines who presents to the emergency department today because of emesis.  Sounds like last Monday or Tuesday patient started having abdominal distention and just generally does not feel well with some nausea.  She did have a episode of emesis probably each day for the first few days and went to see urgent care.  They checked labs and got a CT scan which showed some dilated intrahepatic and extrahepatic biliary ducts.  Has had an ERCP in the past along with what sounds like a sphincterotomy.  This was about 11 years ago.  No issues with this since then.  She had labs done earlier today that showed mildly elevated AST and ALT.  Her bilirubin today was 1.7.  It does appear in the past that she usually hangs out in the 1.2-1.3 range sometimes as high as 1.5.  Saw gastroenterology today that was planning on calling her tomorrow with follow-up for her results and further recommendations.  She states that the pain comes in waves and is almost always epigastric in nature. no associated shortness of breath..  No persistent alcohol, drug or tobacco use.  No trauma.  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 Authorizing Provider  rizatriptan (MAXALT-MLT) 10 MG disintegrating tablet Take 1 tablet by mouth as needed. 07/10/17   [provider]    Social History Social History   Tobacco Use   Smoking status: Never   Smokeless tobacco: Never  Vaping Use   Vaping Use: Never used  Substance Use Topics   Alcohol  use: Yes    Alcohol/week: 3.0 standard drinks    Types: 3 Standard drinks or equivalent per week   Drug use: No    Review of Systems: Documented in HPI ____________________________________________  PHYSICAL EXAM: VITAL SIGNS: ED Triage Vitals  Enc Vitals Group     BP 06/19/21 2013 (!) 143/104     Pulse Rate 06/19/21 2013 71     Resp 06/19/21 2013 20     Temp 06/19/21 2013 98.1 F (36.7 C)     Temp Source 06/19/21 2013 Oral     SpO2 06/19/21 2013 99 %     Weight 06/19/21 2011 156 lb 15.5 oz (71.2 kg)     Height 06/19/21 2011 5\' 7"  (1.702 m)   Physical Exam Vitals and nursing note reviewed.  Constitutional:      Appearance: She is well-developed.  HENT:     Head: Normocephalic and atraumatic.     Mouth/Throat:     Mouth: Mucous membranes are moist.     Pharynx: Oropharynx is clear.  Eyes:     General: No scleral icterus.    Pupils: Pupils are equal, round, and reactive to light.  Cardiovascular:     Rate and Rhythm: Normal rate and regular rhythm.  Pulmonary:     Effort: Pulmonary effort is normal. No respiratory distress.     Breath sounds: No stridor.  Abdominal:  General: Abdomen is flat. There is no distension.     Tenderness: There is no abdominal tenderness.     Hernia: No hernia is present.  Musculoskeletal:     Cervical back: Normal range of motion.  Skin:    General: Skin is warm and dry.  Neurological:     General: No focal deficit present.     Mental Status: She is alert.      ____________________________________________   LABS (all labs ordered are listed, but only abnormal results are displayed)  Labs Reviewed  URINALYSIS, ROUTINE W REFLEX MICROSCOPIC - Abnormal; Notable for the following components:      Result Value   Hgb urine dipstick TRACE (*)    Ketones, ur 80 (*)    All other components within normal limits  URINALYSIS, MICROSCOPIC (REFLEX) - Abnormal; Notable for the following components:   Bacteria, UA FEW (*)    All other  components within normal limits  CBC WITH DIFFERENTIAL/PLATELET - Abnormal; Notable for the following components:   Platelets 411 (*)    All other components within normal limits  COMPREHENSIVE METABOLIC PANEL - Abnormal; Notable for the following components:   Glucose, Bld 103 (*)    AST 259 (*)    ALT 250 (*)    Total Bilirubin 2.5 (*)    All other components within normal limits  RESP PANEL BY RT-PCR (FLU A&B, COVID) ARPGX2  PREGNANCY, URINE  LIPASE, BLOOD  BASIC METABOLIC PANEL  HEPATIC FUNCTION PANEL  CBC WITH DIFFERENTIAL/PLATELET  TYPE AND SCREEN   ____________________________________________  EKG   EKG Interpretation  Date/Time:    Ventricular Rate:    PR Interval:    QRS Duration:   QT Interval:    QTC Calculation:   R Axis:     Text Interpretation:          ____________________________________________  RADIOLOGY  No results found. ____________________________________________  PROCEDURES  Procedure(s) performed:   Procedures ____________________________________________  INITIAL IMPRESSION / ASSESSMENT AND PLAN   This patient presents to the ED for concern of emesis and epigastric pain, this involves an extensive number of treatment options, and is a complaint that carries with it a high risk of complications and morbidity.  The differential diagnosis includes pancreatitis, ACS, sphincter of Oddi dysfunction/stricture, gastritis. Will verify labs.  Had a CT scan done a couple days ago do not see a need to repeat that.  Will help with symptomatic care.  See what labs show and how she is feeling and then decide on discussion with GI.  We will also get a screening EKG.  Could very well just be gastritis or ulcer but still would likely need to see GI to get that diagnosed especially with the persistent vomiting at this time.  Additional history obtained:  Additional history obtained from husband at bedside.  Previous records obtained and reviewed in epic  and care everywhere from [facility]  Co morbidities that complicate the patient evaluation  Gilberts  Social Determinants of Health:  Social drinker  ED Course  Images ordered viewed and obtained by myself. Agree with Radiology interpretation. Details in ED course.  Labs ordered reviewed by myself as detailed in ED course.  Consultations obtained/considered detailed in ED course.  AST, ALT and bilirubin significantly higher than they were earlier today.  Rest of labs were reassuring.  I will discuss this finding with gastroenterology.  Gust with Dr. Dulce Sellar who requested an MRCP to be done and admit to [facility] pending  consultation.  Discussed with Dr. Toniann Fail with [facility] who accepts for admission.  Patient boarded in our emergency room for a few hours and it did not appear that she was going to begin an inpatient bed today so patient was transferred emergency department to emergency department so that she could get her work-up started.  MRCP ordered.  Dr. Bebe Shaggy aware and accepts patient to the ED at [facility].      Cardiac Monitoring:  The patient was maintained on a cardiac monitor.  I personally viewed and interpreted the cardiac monitored which showed an underlying rhythm of: sinus rhythm  CRITICAL INTERVENTIONS:  N/a  Reevaluation:  After the interventions noted above, I reevaluated the patient and found that they have :stayed the same  FINAL IMPRESSION AND PLAN Final diagnoses:  None     Medical screening exam was performed and I feel the patient has had appropriate emergency department evaluation and work-up for their chief complaint and is stable for ADMISSION to the hospitalist time.  I discussed with Dr. Dulce Sellar with GI and Dr. Toniann Fail with [facility] service and discussed labs, imaging and other work-up in the emergency room.  They agree to admission for further management and work-up of said condition. Dr. Dulce Sellar will  see at [facility], requests MRCP.   ____________________________________________   NEW OUTPATIENT MEDICATIONS STARTED DURING THIS VISIT:  New Prescriptions   No medications on file    Note:  This note was prepared with assistance of Dragon voice recognition software. Occasional wrong-word or sound-a-like substitutions may have occurred due to the inherent limitations of voice recognition software.    Kiven Vangilder, Barbara Cower, MD 06/20/21 [phone]

## 2021-06-19 NOTE — ED Triage Notes (Signed)
Abdominal pain x 2 weeks. CT has been done. She has seen a GI specialist today. She is here with vomiting.

## 2021-06-20 ENCOUNTER — Observation Stay (HOSPITAL_COMMUNITY): Payer: 59

## 2021-06-20 ENCOUNTER — Encounter (HOSPITAL_COMMUNITY): Payer: Self-pay | Admitting: Internal Medicine

## 2021-06-20 DIAGNOSIS — R1033 Periumbilical pain: Secondary | ICD-10-CM

## 2021-06-20 DIAGNOSIS — R7989 Other specified abnormal findings of blood chemistry: Secondary | ICD-10-CM

## 2021-06-20 DIAGNOSIS — R109 Unspecified abdominal pain: Secondary | ICD-10-CM | POA: Diagnosis present

## 2021-06-20 LAB — COMPREHENSIVE METABOLIC PANEL
ALT: 250 U/L — ABNORMAL HIGH (ref 0–44)
AST: 259 U/L — ABNORMAL HIGH (ref 15–41)
Albumin: 4.3 g/dL (ref 3.5–5.0)
Alkaline Phosphatase: 111 U/L (ref 38–126)
Anion gap: 11 (ref 5–15)
BUN: 12 mg/dL (ref 6–20)
CO2: 23 mmol/L (ref 22–32)
Calcium: 9.1 mg/dL (ref 8.9–10.3)
Chloride: 102 mmol/L (ref 98–111)
Creatinine, Ser: 0.66 mg/dL (ref 0.44–1.00)
GFR, Estimated: >60 mL/min (ref >60)
Glucose, Bld: 103 mg/dL — ABNORMAL HIGH (ref 70–99)
Potassium: 3.7 mmol/L (ref 3.5–5.1)
Sodium: 136 mmol/L (ref 135–145)
Total Bilirubin: 2.5 mg/dL — ABNORMAL HIGH (ref 0.3–1.2)
Total Protein: 7.7 g/dL (ref 6.5–8.1)

## 2021-06-20 LAB — CBC WITH DIFFERENTIAL/PLATELET
Abs Immature Granulocytes: 0.01 10*3/uL (ref 0.00–0.07)
Abs Immature Granulocytes: 0.02 10*3/uL (ref 0.00–0.07)
Basophils Absolute: 0 10*3/uL (ref 0.0–0.1)
Basophils Absolute: 0 10*3/uL (ref 0.0–0.1)
Basophils Relative: 0 %
Basophils Relative: 1 %
Eosinophils Absolute: 0 10*3/uL (ref 0.0–0.5)
Eosinophils Absolute: 0.3 10*3/uL (ref 0.0–0.5)
Eosinophils Relative: 1 %
Eosinophils Relative: 4 %
HCT: 39.3 % (ref 36.0–46.0)
HCT: 41.1 % (ref 36.0–46.0)
Hemoglobin: 13.8 g/dL (ref 12.0–15.0)
Hemoglobin: 14.7 g/dL (ref 12.0–15.0)
Immature Granulocytes: 0 %
Immature Granulocytes: 0 %
Lymphocytes Relative: 14 %
Lymphocytes Relative: 21 %
Lymphs Abs: 1.1 10*3/uL (ref 0.7–4.0)
Lymphs Abs: 1.5 10*3/uL (ref 0.7–4.0)
MCH: 29 pg (ref 26.0–34.0)
MCH: 29.1 pg (ref 26.0–34.0)
MCHC: 35.1 g/dL (ref 30.0–36.0)
MCHC: 35.8 g/dL (ref 30.0–36.0)
MCV: 81.1 fL (ref 80.0–100.0)
MCV: 82.7 fL (ref 80.0–100.0)
Monocytes Absolute: 0.5 10*3/uL (ref 0.1–1.0)
Monocytes Absolute: 0.7 10*3/uL (ref 0.1–1.0)
Monocytes Relative: 10 %
Monocytes Relative: 7 %
Neutro Abs: 4.6 10*3/uL (ref 1.7–7.7)
Neutro Abs: 6.4 10*3/uL (ref 1.7–7.7)
Neutrophils Relative %: 64 %
Neutrophils Relative %: 78 %
Platelets: 352 10*3/uL (ref 150–400)
Platelets: 411 10*3/uL — ABNORMAL HIGH (ref 150–400)
RBC: 4.75 MIL/uL (ref 3.87–5.11)
RBC: 5.07 MIL/uL (ref 3.87–5.11)
RDW: 12 % (ref 11.5–15.5)
RDW: 12.1 % (ref 11.5–15.5)
WBC: 7.2 10*3/uL (ref 4.0–10.5)
WBC: 8.1 10*3/uL (ref 4.0–10.5)
nRBC: 0 % (ref 0.0–0.2)
nRBC: 0 % (ref 0.0–0.2)

## 2021-06-20 LAB — BASIC METABOLIC PANEL
Anion gap: 9 (ref 5–15)
BUN: 12 mg/dL (ref 6–20)
CO2: 25 mmol/L (ref 22–32)
Calcium: 9.1 mg/dL (ref 8.9–10.3)
Chloride: 103 mmol/L (ref 98–111)
Creatinine, Ser: 0.69 mg/dL (ref 0.44–1.00)
GFR, Estimated: >60 mL/min (ref >60)
Glucose, Bld: 87 mg/dL (ref 70–99)
Potassium: 3.8 mmol/L (ref 3.5–5.1)
Sodium: 137 mmol/L (ref 135–145)

## 2021-06-20 LAB — RESP PANEL BY RT-PCR (FLU A&B, COVID) ARPGX2
Influenza A by PCR: NEGATIVE
Influenza B by PCR: NEGATIVE
SARS Coronavirus 2 by RT PCR: NEGATIVE

## 2021-06-20 LAB — HEPATIC FUNCTION PANEL
ALT: 236 U/L — ABNORMAL HIGH (ref 0–44)
AST: 180 U/L — ABNORMAL HIGH (ref 15–41)
Albumin: 4.2 g/dL (ref 3.5–5.0)
Alkaline Phosphatase: 109 U/L (ref 38–126)
Bilirubin, Direct: 0.2 mg/dL (ref 0.0–0.2)
Indirect Bilirubin: 2.5 mg/dL — ABNORMAL HIGH (ref 0.3–0.9)
Total Bilirubin: 2.7 mg/dL — ABNORMAL HIGH (ref 0.3–1.2)
Total Protein: 7.2 g/dL (ref 6.5–8.1)

## 2021-06-20 LAB — ABO/RH: ABO/RH(D): A NEG

## 2021-06-20 LAB — TYPE AND SCREEN
ABO/RH(D): A NEG
Antibody Screen: NEGATIVE

## 2021-06-20 LAB — LIPASE, BLOOD: Lipase: 35 U/L (ref 11–51)

## 2021-06-20 MED ORDER — ONDANSETRON HCL 4 MG/2ML IJ SOLN: 4.0000 mg | Freq: Four times a day (QID) | INTRAMUSCULAR | Status: DC | PRN

## 2021-06-20 MED ORDER — LACTATED RINGERS IV SOLN: INTRAVENOUS | Status: AC

## 2021-06-20 MED ORDER — ENSURE ENLIVE PO LIQD
237.0000 mL | Freq: Two times a day (BID) | ORAL | Status: DC
  Administered 2021-06-21: 237 mL via ORAL

## 2021-06-20 MED ORDER — GADOBUTROL 1 MMOL/ML IV SOLN
7.0000 mL | Freq: Once | INTRAVENOUS | Status: AC | PRN
  Administered 2021-06-20: 7 mL via INTRAVENOUS

## 2021-06-20 MED ORDER — ONDANSETRON HCL 4 MG PO TABS: 4.0000 mg | ORAL_TABLET | Freq: Four times a day (QID) | ORAL | Status: DC | PRN

## 2021-06-20 MED ORDER — HYDROMORPHONE HCL 1 MG/ML IJ SOLN: 0.5000 mg | INTRAMUSCULAR | Status: DC | PRN

## 2021-06-20 NOTE — Consult Note (Signed)
Referring Provider: Jonnie Finner, DO Primary Care Physician:  Bartholome Bill, MD Primary Gastroenterologist:  Althia Forts  Reason for Consultation:  Abdominal pain, elevated LFTs, dilated biliary ducts  HPI: [name] is a 41 y.o. female who presented to the ED 06/19/21 for nausea/vomiting and abdominal pain/distention. Patient was seen at urgent care last week, where labs were drawn and she had a CT that showed dilated intrahepatic and extrahepatic biliary ducts. Labs showed elevated AST and ALT. She reports epigastric abdominal pain that comes in waves.  Patient sees [facility] and had a consult with them 06/19/21 before coming to the ED. 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 She also has a history of Gilbert's.  Patient was seen in the ED today, resting comfortably in bed, her husband is with her.  Patient states she began having abdominal distention and was generally feeling unwell about a week ago.  This progressed to one episode of vomiting daily with epigastric pain.  On 2/1 patient had more severe symptoms which prompted her to go to urgent care. Yesterday her symptoms were at their worst, and she vomited about 8 times. She states her symptoms yesterday were similar to how she felt prior to cholecystectomy and ERCP years ago. Denies any hematemesis or coffee-ground emesis.   Patient states epigastric pain comes in waves.  Denies any pain at this time but states that pain begins after eating and will gradually worsen over a couple hours to the point that she vomits.  Pain improves after vomiting.  Patient has regular bowel movements, though she reports some looser stools on Wednesday of last week that have now gone back to normal.  Denies hematochezia, melena.  She and her husband report that she has probably lost between 5 and 8 pounds in the last 1-2 weeks.   Patient  denies use of regular medications, is not on any blood thinners. She takes NSAIDs periodically for headaches or menstrual pain.   She has never had a colonoscopy.  Her mother had a cholecystectomy but patient denies other family history of GI malignancy or disease.  Case was discussed with Dr. Paulita Fujita who requested MRCP and to admit pending consultation.  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 Authorizing Provider  rizatriptan (MAXALT-MLT) 10 MG disintegrating tablet Take 1 tablet by mouth as needed. 07/10/17   [provider]    Scheduled Meds: Continuous Infusions:  lactated ringers 100 mL/hr at 06/20/21 0628   PRN Meds:.  Allergies as of 06/19/2021   (No Known Allergies)    Family History  Problem Relation Age of Onset   Hypertension Mother    Lung cancer Mother        non smoker    Social History   Socioeconomic History   Marital status: Married    Spouse name: Not on file   Number of children: Not on file   Years of education: Not on file   Highest education  level: Not on file  Occupational History   Not on file  Tobacco Use   Smoking status: Never   Smokeless tobacco: Never  Vaping Use   Vaping Use: Never used  Substance and Sexual Activity   Alcohol use: Yes    Alcohol/week: 3.0 standard drinks    Types: 3 Standard drinks or equivalent per week   Drug use: No   Sexual activity: Yes    Partners: Male    Birth control/protection: Surgical    Comment: spouse with vasectomy  Other Topics Concern   Not on file  Social History Narrative   Not on file   Social Determinants of Health   Financial  Resource Strain: Not on file  Food Insecurity: Not on file  Transportation Needs: Not on file  Physical Activity: Not on file  Stress: Not on file  Social Connections: Not on file  Intimate Partner Violence: Not on file    Review of Systems: Review of Systems  Constitutional:  Positive for weight loss (estimated 5-8 pounds). Negative for fever.  HENT:  Negative for ear pain and hearing loss.   Eyes:  Negative for discharge and redness.  Respiratory:  Negative for hemoptysis and wheezing.   Cardiovascular:  Negative for chest pain and leg swelling.  Gastrointestinal:  Positive for abdominal pain, diarrhea (looser stools once last week, now normal), nausea and vomiting. Negative for blood in stool, constipation, heartburn and melena.  Genitourinary:  Negative for dysuria and hematuria.  Musculoskeletal:  Negative for falls and myalgias.  Skin:  Negative for itching and rash.  Neurological:  Negative for seizures and loss of consciousness.  Endo/Heme/Allergies:  Negative for polydipsia. Does not bruise/bleed easily.  Psychiatric/Behavioral:  Negative for substance abuse. The patient is not nervous/anxious.     Physical Exam:  Physical Exam Constitutional:      General: She is not in acute distress.    Appearance: She is not toxic-appearing.  HENT:     Head: Normocephalic and atraumatic.     Right Ear: External ear normal.     Left Ear: External ear normal.     Nose: Nose normal.     Mouth/Throat:     Mouth: Mucous membranes are moist.     Pharynx: Oropharynx is clear.  Eyes:     Conjunctiva/sclera: Conjunctivae normal.     Pupils: Pupils are equal, round, and reactive to light.  Cardiovascular:     Rate and Rhythm: Normal rate and regular rhythm.     Pulses: Normal pulses.     Heart sounds: Normal heart sounds. No murmur heard.   No friction rub. No gallop.  Pulmonary:     Effort: Pulmonary effort is normal. No respiratory distress.     Breath sounds: Normal breath sounds.  No wheezing.  Abdominal:     General: Abdomen is flat. Bowel sounds are normal. There is no distension.     Palpations: Abdomen is soft. There is no mass.     Tenderness: There is no abdominal tenderness. There is no guarding.  Musculoskeletal:        General: No swelling.     Cervical back: Normal range of motion and neck supple.     Right lower leg: No edema.     Left lower leg: No edema.  Skin:    General: Skin is warm and dry.     Coloration: Skin is not jaundiced.  Neurological:     General: No focal deficit present.     Mental  Status: She is alert and oriented to person, place, and time.  Psychiatric:        Mood and Affect: Mood normal.        Behavior: Behavior normal.        Thought Content: Thought content normal.        Judgment: Judgment normal.     Vital signs: Vitals:   06/20/21 0600 06/20/21 0700  BP: 127/83 130/82  Pulse: (!) 59 63  Resp: 11 13  Temp: 97.9 F (36.6 C)   SpO2: 99% 99%        GI:  Lab Results: Recent Labs    06/20/21 0001 06/20/21 0607  WBC 8.1 7.2  HGB 14.7 13.8  HCT 41.1 39.3  PLT 411* 352   BMET Recent Labs    06/20/21 0001 06/20/21 0607  NA 136 137  K 3.7 3.8  CL 102 103  CO2 23 25  GLUCOSE 103* 87  BUN 12 12  CREATININE 0.66 0.69  CALCIUM 9.1 9.1   LFT Recent Labs    06/20/21 0607  PROT 7.2  ALBUMIN 4.2  AST 180*  ALT 236*  ALKPHOS 109  BILITOT 2.7*  BILIDIR 0.2  IBILI 2.5*   PT/INR No results for input(s): LABPROT, INR in the last 72 hours.   Studies/Results: No results found.  Impression:  Elevated LFTs/Biliary duct dilation - AST 180 (down from 259), ALT 236 (down from 250) - Tbili 2.7 - Lipase 35 - Normal CBC with WBC 7.2, Hgb 13.8 - CT on 06/16/21 revealed the following:  Dilated intra and extrahepatic biliary ducts of unclear etiology.   Nonobstructive renal calculi and presumed tiny left renal cyst.  Small sliding hiatal hernia.  Plan:  - Will get MRCP to rule out  choledocholithiasis, if positive will proceed with ERCP. I thoroughly discussed procedure with the patient to include nature, alternatives, benefits, and risks (including but not limited to post ERCP pancreatitis, bleeding, infection, perforation, anesthesia/cardiac pulmonary complications).  Patient verbalized understanding and gave verbal consent to proceed with ERCP.  - NPO until post MRCP. - Continue to trend LFTs  - [facility] will follow     LOS: 0 days   Angelique Holm  PA-C 06/20/2021, 7:57 AM  Contact #  [phone]

## 2021-06-20 NOTE — H&P (Signed)
History and Physical    PatientMarland Kitchen [name] NID:[id number] DOB: Sep 09, 1980 DOA: 06/19/2021 DOS: the patient was seen and examined on 06/20/2021 PCP: Verlon Au, MD  Patient coming from: Home  Chief Complaint:  Chief Complaint  Patient presents with   Abdominal Pain    HPI: [name] is a 41 y.o. female with medical history significant of Gilberts disease, migraines. Presenting with N/V/abdominal pain. Her stomach pain started 1 week ago. It initially felt like bloating, but developed in to periumbilical and epigastric pain. It comes in waves. Eating seemed to make it worse. She didn't try any medicines or therapies to make it better. She went to Urgent Care and they ordered a CT. It was concerning for biliary duct dilation, so she was referred to GI. She saw [facility] yesterday and they got labs. They showed elevated bili and LFTs. Before she could follow up on results, her abdominal pain worsened. So she decided to come to the ED for evaluation. She denies any other aggravating or alleviating factors.   Review of Systems: As mentioned in the history of present illness. All other systems reviewed and are negative. 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 She has never used smokeless tobacco. She reports current alcohol use of about 3.0 standard drinks per week. She reports that she does not use drugs.  No Known Allergies  Family History  Problem Relation Age of Onset   Hypertension Mother    Lung cancer Mother        non smoker    Prior to Admission medications   Medication Sig Start Date End Date Taking? Authorizing Provider  rizatriptan (MAXALT-MLT) 10 MG  disintegrating tablet Take 1 tablet by mouth as needed. 07/10/17   [provider]    Physical Exam: Vitals:   06/20/21 0500 06/20/21 0555 06/20/21 0600 06/20/21 0700  BP: 110/77  127/83 130/82  Pulse: 63  (!) 59 63  Resp: 13  11 13   Temp:   97.9 F (36.6 C)   TempSrc:      SpO2: 100% 100% 99% 99%  Weight:  70.3 kg 70.3 kg   Height:  5\' 7"  (1.702 m) 5\' 7"  (1.702 m)    General: 41 y.o. female resting in bed in NAD Eyes: PERRL, normal sclera ENMT: Nares patent w/o discharge, orophaynx clear, dentition normal, ears w/o discharge/lesions/ulcers Neck: Supple, trachea midline Cardiovascular: RRR, +S1, S2, no m/g/r, equal pulses throughout Respiratory: CTABL, no w/r/r, normal WOB GI: BS+, ND, mild TTP epigastric, no masses noted, no organomegaly noted MSK: No e/c/c Neuro: A&O x 3, no focal deficits Psyc: Appropriate interaction and affect, calm/cooperative   Data Reviewed:  T bili 2.7 AST  180 ALT 236 WBC 7.2   Assessment and Plan: No notes have been filed under this hospital service. Service: Hospitalist Abdominal pain Hyperbilirubinemia Elevated LFTs Hepatobiliary ductal dilation     - place in obs, tele     - MRCP ordered; NPO for now     - [facility] onboard, defer further imaging/procedures to them     -  anti-emetics and pain control  Advance Care Planning: FULL  Consults: [facility]  Family Communication: w/ husband at bedside  Severity of Illness: The appropriate patient status for this patient is OBSERVATION. Observation status is judged to be reasonable and necessary in order to provide the required intensity of service to ensure the patient's safety. The patient's presenting symptoms, physical exam findings, and initial radiographic and laboratory data in the context of their medical condition is felt to place them at decreased risk for further clinical deterioration. Furthermore, it is anticipated that the patient will be medically stable for discharge  from the hospital within 2 midnights of admission.   Author: Teddy Spike, DO 06/20/2021 8:23 AM  For on call review [email or web address].

## 2021-06-20 NOTE — ED Triage Notes (Signed)
Patient from [facility] with c/o abdominal pain for "a while."  Patient transferred here for MRCP and poss admission in the AM.

## 2021-06-21 DIAGNOSIS — R17 Unspecified jaundice: Secondary | ICD-10-CM

## 2021-06-21 LAB — CBC
HCT: 38.6 % (ref 36.0–46.0)
Hemoglobin: 13.5 g/dL (ref 12.0–15.0)
MCH: 29 pg (ref 26.0–34.0)
MCHC: 35 g/dL (ref 30.0–36.0)
MCV: 83 fL (ref 80.0–100.0)
Platelets: 329 10*3/uL (ref 150–400)
RBC: 4.65 MIL/uL (ref 3.87–5.11)
RDW: 12.1 % (ref 11.5–15.5)
WBC: 4.9 10*3/uL (ref 4.0–10.5)
nRBC: 0 % (ref 0.0–0.2)

## 2021-06-21 LAB — COMPREHENSIVE METABOLIC PANEL
ALT: 144 U/L — ABNORMAL HIGH (ref 0–44)
AST: 67 U/L — ABNORMAL HIGH (ref 15–41)
Albumin: 3.7 g/dL (ref 3.5–5.0)
Alkaline Phosphatase: 90 U/L (ref 38–126)
Anion gap: 8 (ref 5–15)
BUN: 11 mg/dL (ref 6–20)
CO2: 24 mmol/L (ref 22–32)
Calcium: 8.8 mg/dL — ABNORMAL LOW (ref 8.9–10.3)
Chloride: 104 mmol/L (ref 98–111)
Creatinine, Ser: 0.59 mg/dL (ref 0.44–1.00)
GFR, Estimated: >60 mL/min (ref >60)
Glucose, Bld: 83 mg/dL (ref 70–99)
Potassium: 4 mmol/L (ref 3.5–5.1)
Sodium: 136 mmol/L (ref 135–145)
Total Bilirubin: 2.4 mg/dL — ABNORMAL HIGH (ref 0.3–1.2)
Total Protein: 6.5 g/dL (ref 6.5–8.1)

## 2021-06-21 LAB — ANTI-SMOOTH MUSCLE ANTIBODY, IGG: F-Actin IgG: 9 Units (ref 0–19)

## 2021-06-21 LAB — HIV ANTIBODY (ROUTINE TESTING W REFLEX): HIV Screen 4th Generation wRfx: NONREACTIVE

## 2021-06-21 LAB — MITOCHONDRIAL ANTIBODIES: Mitochondrial M2 Ab, IgG: <20 Units (ref 0.0–20.0)

## 2021-06-21 MED ORDER — RIZATRIPTAN BENZOATE 10 MG PO TBDP: 10.0000 mg | ORAL_TABLET | Freq: Every day | ORAL | Status: DC | PRN

## 2021-06-21 MED ORDER — SUMATRIPTAN SUCCINATE 50 MG PO TABS
100.0000 mg | ORAL_TABLET | ORAL | Status: DC | PRN
  Filled 2021-06-21: qty 2

## 2021-06-21 MED ORDER — IBUPROFEN 200 MG PO TABS
400.0000 mg | ORAL_TABLET | Freq: Four times a day (QID) | ORAL | Status: DC | PRN
  Administered 2021-06-21: 600 mg via ORAL
  Administered 2021-06-22: 400 mg via ORAL
  Filled 2021-06-21: qty 2
  Filled 2021-06-21: qty 3

## 2021-06-21 NOTE — Progress Notes (Signed)
[facility] Gastroenterology Progress Note  [name] 41 y.o. 01-22-1981  CC:  Abdominal pain, elevated LFTs   Subjective: Patient states she is feeling better today. She was able to tolerate a few bites of cereal last night without further episodes of vomiting. Denies nausea, vomiting, abdominal pain today. She had a normal bowel movement last night, brown, formed. Denies hematemesis, hematochezia, melena, fever, chills.  ROS : Review of Systems  Constitutional:  Negative for chills and fever.  Respiratory:  Negative for cough and shortness of breath.   Cardiovascular:  Negative for chest pain and leg swelling.  Gastrointestinal:  Negative for abdominal pain, blood in stool, constipation, diarrhea, heartburn, melena, nausea and vomiting.     Objective: Vital signs in last 24 hours: Vitals:   06/20/21 2010 06/21/21 0331  BP: 126/78 121/78  Pulse: 63 68  Resp:  20  Temp: 98.5 F (36.9 C) 98 F (36.7 C)  SpO2: 100% 100%    Physical Exam:  General:  Alert, cooperative, no distress, appears stated age  Head:  Normocephalic, without obvious abnormality, atraumatic  Eyes:  Anicteric sclera, EOM's intact  Lungs:   Clear to auscultation bilaterally, respirations unlabored  Heart:  Regular rate and rhythm, S1, S2 normal  Abdomen:   Soft, non-tender, bowel sounds active all four quadrants,  no masses,   Extremities: Extremities normal, atraumatic, no  edema  Pulses: 2+ and symmetric    Lab Results: Recent Labs    06/20/21 0607 06/21/21 0511  NA 137 136  K 3.8 4.0  CL 103 104  CO2 25 24  GLUCOSE 87 83  BUN 12 11  CREATININE 0.69 0.59  CALCIUM 9.1 8.8*   Recent Labs    06/20/21 0607 06/21/21 0511  AST 180* 67*  ALT 236* 144*  ALKPHOS 109 90  BILITOT 2.7* 2.4*  PROT 7.2 6.5  ALBUMIN 4.2 3.7   Recent Labs    06/20/21 0001 06/20/21 0607 06/21/21 0511  WBC 8.1 7.2 4.9  NEUTROABS 6.4 4.6  --   HGB 14.7 13.8 13.5  HCT 41.1 39.3 38.6  MCV 81.1 82.7 83.0  PLT 411*  352 329   No results for input(s): LABPROT, INR in the last 72 hours.    Assessment Abdominal pain, elevated LFTs - AST 67 (180), ALT 144 (236)  - Tbili 2/4 (2.7) - Lipase 35 - Normal CBC with WBC 4.9, Hgb 13.5 - MRCP 06/20/2021 revealed the following:  1. Status post cholecystectomy, without common duct dilatation. 2. Peripheral, segmental intrahepatic duct dilatation within segments 8 and less so 6. Considerations include strictures secondary to prior trauma/cholangitis versus early primary sclerosing cholangitis, recurrent pyogenic cholangitis, or AIDS cholangiopathy.   Plan: Advance diet as tolerated. Will follow up on pending lab results.  Will follow up in 2-4 weeks as outpatient to consider possibility of ERCP. As long as tolerating diet, patient can be discharged from GI standpoint. Will sign off.  Berdine Dance PA-C 06/21/2021, 10:29 AM  Contact #  [phone]

## 2021-06-21 NOTE — Assessment & Plan Note (Addendum)
-  symptoms gradually improved and pt was ultimately able to tolerate soft diet -Pt did continue to have some residual symptoms. Discussed with GI who will plan to f/u with pt as outpatient

## 2021-06-21 NOTE — Progress Notes (Addendum)
°  Progress Note   Patient: [name] OIZ:[id number] DOB: January 07, 1981 DOA: 06/19/2021     0 DOS: the patient was seen and examined on 06/21/2021   Brief hospital course: No notes on file  Assessment and Plan: * Abdominal pain- (present on admission) -Denied abd pain this AM and reportedly tolerated dinner overnight -This afternoon, reported continued abd pain after eating lunch -GI had been following who had recommended d/c if pt tolerated PO. As pt did not tolerate lunch, will cont to monitor in hospital. Have discussed with GI who is aware  Elevated LFTs -LFT's trending down -MRCP reviewed. Findings with peripheral segmental intrahepatic duct dilation. Pt is s/p cholecystectomy without CBD dilitation -GI had been following, see above  Degeneration of lumbar intervertebral disc- (present on admission) -Cont maxalt and nsaid as tolerated per home regimen -complaining of headache this AM  Hyperbilirubinemia -bili trending down -Repeat LFT's in AM      Subjective: Initially feeling better this AM. Later in afternoon, complained of abd tenderness with diarrhea after eating lunch consisting of salad and mac and cheese  Physical Exam: Vitals:   06/20/21 1521 06/20/21 2010 06/21/21 0331 06/21/21 1405  BP: (!) 121/92 126/78 121/78 116/81  Pulse: 72 63 68 75  Resp: _0 Temp: 98.8 F (37.1 C) 98.5 F (36.9 C) 98 F (36.7 C) 98.2 F (36.8 C)  TempSrc: Oral Oral Oral Oral  SpO2: 99% 100% 100% 99%  Weight:      Height:       General exam: Awake, laying in bed, in nad Respiratory system: Normal respiratory effort, no wheezing Cardiovascular system: regular rate, s1, s2 Gastrointestinal system: Soft, nondistended, positive BS Central nervous system: CN2-12 grossly intact, strength intact Extremities: Perfused, no clubbing Skin: Normal skin turgor, no notable skin lesions seen Psychiatry: Mood normal // no visual hallucinations   Data Reviewed:  Labs reviewed  Family  Communication: Pt in room, family not at bedside  Disposition: Status is: Observation The patient remains OBS appropriate and will d/c before 2 midnights.        Planned Discharge Destination: Home       Author: Marylu Lund, MD 06/21/2021 2:44 PM  For on call review [email or web address].

## 2021-06-21 NOTE — Assessment & Plan Note (Addendum)
-  Cont maxalt and nsaid as tolerated per home regimen

## 2021-06-21 NOTE — Assessment & Plan Note (Addendum)
-  bili trending down

## 2021-06-21 NOTE — Clinical Social Work Note (Signed)
°  Transition of Care [facility]) Screening Note   Patient Details  Name: [name] Date of Birth: 03-Apr-1981   Transition of [facility]) CM/SW Contact:    Ida Rogue, LCSW Phone Number: 06/21/2021, 9:01 AM    Transition of Care Department [facility]) has reviewed patient and no TOC needs have been identified at this time. We will continue to monitor patient advancement through interdisciplinary progression rounds. If new patient transition needs arise, please place a TOC consult.

## 2021-06-21 NOTE — Progress Notes (Signed)
Pt c/o abdominal pain/"tightness like before" and rates pain "3 or 4 out of 10". Pt declines meds at this time. Pt reported she had an English muffin, a few bites of pita bread, and tried a bite of the mac and cheese, but did not like it so did not eat more of mac and cheese for lunch.  Per husband, pt then had a loose BM shortly followed by a large episode of diarrhea. Pt states the abdominal "tightness" is still present.  Pt ambulating in hallway with husband. Pt reports abdominal "tightness" has decreased to a "1 or 2 out of 10".   Dr. Michail Sermon and Dr. Wyline Copas aware. See new diet order.   Will continue to monitor.

## 2021-06-21 NOTE — Assessment & Plan Note (Signed)
-  LFT's trending down -MRCP reviewed. Findings with peripheral segmental intrahepatic duct dilation. Pt is s/p cholecystectomy without CBD dilitation -GI had been following, see above

## 2021-06-21 NOTE — Plan of Care (Signed)

## 2021-06-22 DIAGNOSIS — M5136 Other intervertebral disc degeneration, lumbar region: Secondary | ICD-10-CM | POA: Diagnosis not present

## 2021-06-22 LAB — COMPREHENSIVE METABOLIC PANEL
ALT: 107 U/L — ABNORMAL HIGH (ref 0–44)
AST: 36 U/L (ref 15–41)
Albumin: 4 g/dL (ref 3.5–5.0)
Alkaline Phosphatase: 80 U/L (ref 38–126)
Anion gap: 6 (ref 5–15)
BUN: 11 mg/dL (ref 6–20)
CO2: 26 mmol/L (ref 22–32)
Calcium: 9.2 mg/dL (ref 8.9–10.3)
Chloride: 107 mmol/L (ref 98–111)
Creatinine, Ser: 0.65 mg/dL (ref 0.44–1.00)
GFR, Estimated: >60 mL/min (ref >60)
Glucose, Bld: 92 mg/dL (ref 70–99)
Potassium: 3.9 mmol/L (ref 3.5–5.1)
Sodium: 139 mmol/L (ref 135–145)
Total Bilirubin: 1.3 mg/dL — ABNORMAL HIGH (ref 0.3–1.2)
Total Protein: 6.9 g/dL (ref 6.5–8.1)

## 2021-06-22 NOTE — Progress Notes (Signed)
[facility]astroenterology Progress Note  [name] 41 y.o. November 17, 1980   Subjective: Feels ok. Increased abdominal pain yesterday after eating that resolved after walking. Tolerating clear liquids. Concerned about source of her elevated LFTs.  Objective: Vital signs: Vitals:   06/21/21 1405 06/21/21 2022  BP: 116/81 132/89  Pulse: 75 64  Resp: 14 20  Temp: 98.2 F (36.8 C) 97.8 F (36.6 C)  SpO2: 99% 100%    Physical Exam: Gen: alert, no acute distress, well-nourished, pleasant HEENT: anicteric sclera CV: RRR Chest: CTA B Abd: epigastric tenderness with guarding, soft, nondistended, +BS Ext: no edema  Lab Results: Recent Labs    06/21/21 0511 06/22/21 0448  NA 136 139  K 4.0 3.9  CL 104 107  CO2 24 26  GLUCOSE 83 92  BUN 11 11  CREATININE 0.59 0.65  CALCIUM 8.8* 9.2   Recent Labs    06/21/21 0511 06/22/21 0448  AST 67* 36  ALT 144* 107*  ALKPHOS 90 80  BILITOT 2.4* 1.3*  PROT 6.5 6.9  ALBUMIN 3.7 4.0   Recent Labs    06/20/21 0001 06/20/21 0607 06/21/21 0511  WBC 8.1 7.2 4.9  NEUTROABS 6.4 4.6  --   HGB 14.7 13.8 13.5  HCT 41.1 39.3 38.6  MCV 81.1 82.7 83.0  PLT 411* 352 329      Assessment/Plan: Transaminitis resolving - question early PSC vs sludge in bile ducts. AMA, ASMA negative and ANA pending. LFTs improving. If tolerates soft foods ok to go home today and will check LFTs next week and follow closely. F/U with our office in March to decide on timing of outpt ERCP. D/W Dr. Rhona Leavens.   [name] 06/22/2021, 11:54 AM  Questions please call [phone] Patient ID: [name], female   DOB: [birth year], 41 y.o.   MRN: [id number]

## 2021-06-22 NOTE — Discharge Summary (Signed)
Physician Discharge Summary   Patient: [name] MRN: [id number] DOB: 07-Nov-1980  Admit date:     06/19/2021  Discharge date: 06/22/21  Discharge Physician: [name]   PCP: Betty Au, MD   Recommendations at discharge:    Follow up with PCP in 1-2 weeks Follow up with [name] as scheduled  Discharge Diagnoses: Principal Problem:   Abdominal pain Active Problems:   Migraine with aura   Hyperbilirubinemia   Degeneration of lumbar intervertebral disc   Elevated LFTs  Resolved Problems:   * No resolved hospital problems. *   Hospital Course: 41 y.o. female with medical history significant of Gilberts disease, migraines. Presenting with N/V/abdominal pain. Her stomach pain started 1 week ago. It initially felt like bloating, but developed in to periumbilical and epigastric pain. It comes in waves. Eating seemed to make it worse. She didn't try any medicines or therapies to make it better. She went to Urgent Care and they ordered a CT. It was concerning for biliary duct dilation, so she was referred to Mullen. She saw [name] yesterday and they got labs. They showed elevated bili and LFTs. Before she could follow up on results, her abdominal pain worsened. So she decided to come to the ED for evaluation. She denies any other aggravating or alleviating factors  Assessment and Plan: * Abdominal pain- (present on admission) -symptoms gradually improved and pt was ultimately able to tolerate soft diet -Pt did continue to have some residual symptoms. Discussed with Mullen who will plan to f/u with pt as outpatient  Elevated LFTs -LFT's trending down -MRCP reviewed. Findings with peripheral segmental intrahepatic duct dilation. Pt is s/p cholecystectomy without CBD dilitation -Mullen had been following, see above  Degeneration of lumbar intervertebral disc- (present on admission) -Cont maxalt and nsaid as tolerated per home regimen   Hyperbilirubinemia -bili trending  down            Consultants: Mullen Procedures performed:   Disposition: Home Diet recommendation:  Regular diet  DISCHARGE MEDICATION: Allergies as of 06/22/2021   No Known Allergies      Medication List     STOP taking these medications    acetaminophen 500 MG tablet Commonly known as: TYLENOL   dicyclomine 10 MG capsule Commonly known as: BENTYL       TAKE these medications    ibuprofen 200 MG tablet Commonly known as: ADVIL Take 400-600 mg by mouth every 6 (six) hours as needed for headache or cramping.   promethazine 50 MG tablet Commonly known as: PHENERGAN Take 50 mg by mouth 2 (two) times daily as needed.   rizatriptan 10 MG disintegrating tablet Commonly known as: MAXALT-MLT Take 10 mg by mouth daily as needed for migraine.        Follow-up Information     Betty Au, MD Follow up in 2 week(s).   Specialty: Family Medicine Why: Hospital follow up Contact information: [address] [place] [address] [phone]         Betty Rakes, MD Follow up.   Specialty: Gastroenterology Why: Hospital follow up as scheduled Contact information: [address] [place] [address] [phone]                 Discharge Exam: [name] Weights   06/19/21 2011 06/20/21 0555 06/20/21 0600  Weight: 71.2 kg 70.3 kg 70.3 kg   General exam: Awake, laying in bed, in nad Respiratory system: Normal respiratory effort,  no wheezing Cardiovascular system: regular rate, s1, s2 Gastrointestinal system: Soft, nondistended, positive BS Central nervous system: CN2-12 grossly intact, strength intact Extremities: Perfused, no clubbing Skin: Normal skin turgor, no notable skin lesions seen Psychiatry: Mood normal // no visual hallucinations   Condition at discharge: improving  The results of significant diagnostics from this hospitalization (including imaging, microbiology, ancillary and laboratory)  are listed below for reference.   Imaging Studies: MR 3D Recon At Scanner  Result Date: 06/20/2021 CLINICAL DATA:  Abdominal pain and nausea. Elevated liver function tests. Evaluate for stones.Gilbert's disease. history of cholecystectomy 10+ years ago. Prior ERCP with sphincterotomy. EXAM: MRI ABDOMEN WITHOUT AND WITH CONTRAST (INCLUDING MRCP) TECHNIQUE: Multiplanar multisequence MR imaging of the abdomen was performed both before and after the administration of intravenous contrast. Heavily T2-weighted images of the biliary and pancreatic ducts were obtained, and three-dimensional MRCP images were rendered by post processing. CONTRAST:  88mL GADAVIST GADOBUTROL 1 MMOL/ML IV SOLN COMPARISON:  Today's H and P. FINDINGS: Lower chest: Normal heart size without pericardial or pleural effusion. Probable cyst in the inferolateral right breast of 6 mm. Hepatobiliary: No cirrhosis or suspicious liver lesion. Involving the anterior right hepatic lobe (segment 8) is segmental peripheral intrahepatic biliary duct dilatation. Example images 32/35, 13/14, and 11/10. No underlying obstructive mass. No dominant stone. Cannot exclude intraductal stones or debris within, given mixed T2 signal including on 13/4. Within segment 6, there is a second, more subtle area of peripheral intrahepatic biliary duct dilatation, including on 55/25. Cholecystectomy. Normal common duct caliber including at 6 mm on 13/10. No common duct irregularity or obstructive stone. Pancreas:  Normal, without mass or ductal dilatation. Spleen:  Normal in size, without focal abnormality. Adrenals/Urinary Tract: Normal adrenal glands. Tiny left renal lesions are likely cysts. Normal right kidney. No hydronephrosis. Stomach/Bowel: Proximal gastric underdistention. Normal abdominal bowel loops. Vascular/Lymphatic: Normal caliber of the aorta and branch vessels. No retroperitoneal or retrocrural adenopathy. Other:  No ascites. Musculoskeletal: No acute osseous  abnormality. IMPRESSION: 1. Status post cholecystectomy, without common duct dilatation. 2. Peripheral, segmental intrahepatic duct dilatation within segments 8 and less so 6. Considerations include strictures secondary to prior trauma/cholangitis versus early primary sclerosing cholangitis, recurrent pyogenic cholangitis, or AIDS cholangiopathy. Electronically Signed   By: Jeronimo Greaves M.D.   On: 06/20/2021 11:19   MR ABDOMEN MRCP W WO CONTAST  Result Date: 06/20/2021 CLINICAL DATA:  Abdominal pain and nausea. Elevated liver function tests. Evaluate for stones.Gilbert's disease. history of cholecystectomy 10+ years ago. Prior ERCP with sphincterotomy. EXAM: MRI ABDOMEN WITHOUT AND WITH CONTRAST (INCLUDING MRCP) TECHNIQUE: Multiplanar multisequence MR imaging of the abdomen was performed both before and after the administration of intravenous contrast. Heavily T2-weighted images of the biliary and pancreatic ducts were obtained, and three-dimensional MRCP images were rendered by post processing. CONTRAST:  3mL GADAVIST GADOBUTROL 1 MMOL/ML IV SOLN COMPARISON:  Today's H and P. FINDINGS: Lower chest: Normal heart size without pericardial or pleural effusion. Probable cyst in the inferolateral right breast of 6 mm. Hepatobiliary: No cirrhosis or suspicious liver lesion. Involving the anterior right hepatic lobe (segment 8) is segmental peripheral intrahepatic biliary duct dilatation. Example images 32/35, 13/14, and 11/10. No underlying obstructive mass. No dominant stone. Cannot exclude intraductal stones or debris within, given mixed T2 signal including on 13/4. Within segment 6, there is a second, more subtle area of peripheral intrahepatic biliary duct dilatation, including on 55/25. Cholecystectomy. Normal common duct caliber including at 6 mm on 13/10. No common duct irregularity  or obstructive stone. Pancreas:  Normal, without mass or ductal dilatation. Spleen:  Normal in size, without focal abnormality.  Adrenals/Urinary Tract: Normal adrenal glands. Tiny left renal lesions are likely cysts. Normal right kidney. No hydronephrosis. Stomach/Bowel: Proximal gastric underdistention. Normal abdominal bowel loops. Vascular/Lymphatic: Normal caliber of the aorta and branch vessels. No retroperitoneal or retrocrural adenopathy. Other:  No ascites. Musculoskeletal: No acute osseous abnormality. IMPRESSION: 1. Status post cholecystectomy, without common duct dilatation. 2. Peripheral, segmental intrahepatic duct dilatation within segments 8 and less so 6. Considerations include strictures secondary to prior trauma/cholangitis versus early primary sclerosing cholangitis, recurrent pyogenic cholangitis, or AIDS cholangiopathy. Electronically Signed   By: Jeronimo Greaves M.D.   On: 06/20/2021 11:19    Microbiology: Results for orders placed or performed during the hospital encounter of 06/19/21  Resp Panel by RT-PCR (Flu A&B, Covid) Nasopharyngeal Swab     Status: None   Collection Time: 06/20/21 12:55 AM   Specimen: Nasopharyngeal Swab; Nasopharyngeal(NP) swabs in vial transport medium  Result Value Ref Range Status   SARS Coronavirus 2 by RT PCR NEGATIVE NEGATIVE Final    Comment: (NOTE) SARS-CoV-2 target nucleic acids are NOT DETECTED.  The SARS-CoV-2 RNA is generally detectable in upper respiratory specimens during the acute phase of infection. The lowest concentration of SARS-CoV-2 viral copies this assay can detect is 138 copies/mL. A negative result does not preclude SARS-Cov-2 infection and should not be used as the sole basis for treatment or other patient management decisions. A negative result may occur with  improper specimen collection/handling, submission of specimen other than nasopharyngeal swab, presence of viral mutation(s) within the areas targeted by this assay, and inadequate number of viral copies(<138 copies/mL). A negative result must be combined with clinical observations, patient  history, and epidemiological information. The expected result is Negative.  Fact Sheet for Patients:  BloggerCourse.com  Fact Sheet for Healthcare Providers:  SeriousBroker.it  This test is no t yet approved or cleared by the [place] FDA and  has been authorized for detection and/or diagnosis of SARS-CoV-2 by FDA under an Emergency Use Authorization (EUA). This EUA will remain  in effect (meaning this test can be used) for the duration of the COVID-19 declaration under Section 564(b)(1) of the Act, 21 U.S.C.section 360bbb-3(b)(1), unless the authorization is terminated  or revoked sooner.       Influenza A by PCR NEGATIVE NEGATIVE Final   Influenza B by PCR NEGATIVE NEGATIVE Final    Comment: (NOTE) The Xpert Xpress SARS-CoV-2/FLU/RSV plus assay is intended as an aid in the diagnosis of influenza from Nasopharyngeal swab specimens and should not be used as a sole basis for treatment. Nasal washings and aspirates are unacceptable for Xpert Xpress SARS-CoV-2/FLU/RSV testing.  Fact Sheet for Patients: BloggerCourse.com  Fact Sheet for Healthcare Providers: SeriousBroker.it  This test is not yet approved or cleared by the [place] FDA and has been authorized for detection and/or diagnosis of SARS-CoV-2 by FDA under an Emergency Use Authorization (EUA). This EUA will remain in effect (meaning this test can be used) for the duration of the COVID-19 declaration under Section 564(b)(1) of the Act, 21 U.S.C. section 360bbb-3(b)(1), unless the authorization is terminated or revoked.  Performed at [facility], [address] [place], [place] [address]     Labs: CBC: Recent Labs  Lab 06/20/21 0001 06/20/21 0607 06/21/21 0511  WBC 8.1 7.2 4.9  NEUTROABS 6.4 4.6  --   HGB 14.7 13.8 13.5  HCT 41.1 39.3  38.6  MCV 81.1 82.7 83.0  PLT 411* 352 329    Basic Metabolic Panel: Recent Labs  Lab 06/20/21 0001 06/20/21 0607 06/21/21 0511 06/22/21 0448  NA 136 137 136 139  K 3.7 3.8 4.0 3.9  CL 102 103 104 107  CO2 23 25 24 26   GLUCOSE 103* 87 83 92  BUN 12 12 11 11   CREATININE 0.66 0.69 0.59 0.65  CALCIUM 9.1 9.1 8.8* 9.2   Liver Function Tests: Recent Labs  Lab 06/20/21 0001 06/20/21 0607 06/21/21 0511 06/22/21 0448  AST 259* 180* 67* 36  ALT 250* 236* 144* 107*  ALKPHOS 111 109 90 80  BILITOT 2.5* 2.7* 2.4* 1.3*  PROT 7.7 7.2 6.5 6.9  ALBUMIN 4.3 4.2 3.7 4.0   CBG: No results for input(s): GLUCAP in the last 168 hours.  Discharge time spent: less than 30 minutes.  Signed: Rickey BarbaraStephen Rubye Strohmeyer, MD [facility] 06/22/2021

## 2021-06-26 LAB — FANA STAINING PATTERNS: Homogeneous Pattern: 1

## 2021-06-26 LAB — ANTINUCLEAR ANTIBODIES, IFA: ANA Ab, IFA: POSITIVE — AB

## 2021-08-14 ENCOUNTER — Other Ambulatory Visit: Payer: Self-pay | Admitting: Obstetrics and Gynecology

## 2021-08-14 DIAGNOSIS — Z1231 Encounter for screening mammogram for malignant neoplasm of breast: Secondary | ICD-10-CM

## 2021-09-29 ENCOUNTER — Ambulatory Visit
Admission: RE | Admit: 2021-09-29 | Discharge: 2021-09-29 | Disposition: A | Discharge status: Home / Self Care | Payer: 59 | Source: Ambulatory Visit | Attending: Obstetrics and Gynecology | Admitting: Obstetrics and Gynecology

## 2021-09-29 DIAGNOSIS — Z1231 Encounter for screening mammogram for malignant neoplasm of breast: Secondary | ICD-10-CM

## 2022-02-14 NOTE — Progress Notes (Deleted)
41 y.o. [id number] Married Unavailable Unavailable female here for annual exam.      No LMP recorded.          Sexually active: {yes no:314532}  The current method of family planning is vasectomy.    Exercising: {yes no:314532}  {types:19826} Smoker:  {YES [id number]  Health Maintenance: Pap:   02/10/20 ASCUS Hr HPV Neg,  12/01/18 Negative; 10/16/2017 WNL NEG HPV, 10-04-16 Neg:Pos HR HPV,  History of abnormal Pap:   Yes, Hx CIN I 10/2016, 09-29-15 ASCUS + HR HPV -colposcopy was unsatisfactory, ECC was negative In 316 ASCUS/+HPV (negative 16/18), colposcopy and ECC were negative MMG:  09/29/21 density b Bi-rads  BMD:   n/a Colonoscopy: none  TDaP:  04/01/13 Gardasil: no, has declined in the past    reports that she has never smoked. She has never used smokeless tobacco. She reports current alcohol use of about 3.0 standard drinks of alcohol per week. She reports that she does not use drugs.  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     promethazine (PHENERGAN) 50 MG tablet Take 50 mg by mouth 2 (two) times daily as needed.     rizatriptan (MAXALT-MLT) 10 MG disintegrating tablet Take 10 mg by mouth daily as needed for migraine.  2   No current facility-administered medications for this visit.    Family History  Problem Relation Age of Onset   Hypertension Mother    Lung cancer Mother        non smoker    Review of Systems  Exam:   There were no vitals taken for this visit.  Weight change: @WEIGHTCHANGE @ Height:      Ht Readings from Last 3  Encounters:  06/20/21 5\' 7"  (1.702 m)  02/15/21 5\' 7"  (1.702 m)  02/10/20 5\' 7"  (1.702 m)    General appearance: alert, cooperative and appears stated age Head: Normocephalic, without obvious abnormality, atraumatic Neck: no adenopathy, supple, symmetrical, trachea midline and thyroid {CHL AMB PHY EX THYROID NORM DEFAULT:[phone]::"normal to inspection and palpation"} Lungs: clear to auscultation bilaterally Cardiovascular: regular rate and rhythm Breasts: {Exam; breast:13139::"normal appearance, no masses or tenderness"} Abdomen: soft, non-tender; non distended,  no masses,  no organomegaly Extremities: extremities normal, atraumatic, no cyanosis or edema Skin: Skin color, texture, turgor normal. No rashes or lesions Lymph nodes: Cervical, supraclavicular, and axillary nodes normal. No abnormal inguinal nodes palpated Neurologic: Grossly normal   Pelvic: External genitalia:  no lesions              Urethra:  normal appearing urethra with no masses, tenderness or lesions              Bartholins and Skenes: normal                 Vagina: normal appearing vagina with normal color and discharge, no lesions              Cervix: {CHL AMB PHY  EX CERVIX NORM [phone]::"no lesions"}               Bimanual Exam:  Uterus:  {CHL AMB PHY EX UTERUS NORM DEFAULT:[phone]::"normal size, contour, position, consistency, mobility, non-tender"}              Adnexa: {CHL AMB PHY EX ADNEXA NO MASS [phone]::"no mass, fullness, tenderness"}               Rectovaginal: Confirms               Anus:  normal sphincter tone, no lesions  *** chaperoned for the exam.  A:  Well Woman with normal exam  P:

## 2022-02-21 ENCOUNTER — Ambulatory Visit: Payer: 59 | Admitting: Obstetrics and Gynecology

## 2022-03-22 NOTE — Progress Notes (Unsigned)
41 y.o. [id number] Married Unavailable Unavailable female here for annual exam.  She is still having pretty heavy periods. Prior negative w/u for menorrhagia and severe dysmenorrhea. Can't take OCP's, no help with POP. Not anemic. Period Cycle (Days): 28 Period Duration (Days): 5 Period Pattern: Regular Menstrual Flow: Heavy Menstrual Control: Tampon Menstrual Control Change Freq (Hours): 1-2 Dysmenorrhea: (!) Moderate Dysmenorrhea Symptoms: Cramping  She has had elevated LFT's, not sure the etiology, her labs are improving. Last LFT's were normal.   Patient's last menstrual period was 03/18/2022 (approximate).          Sexually active: Yes.    The current method of family planning is vasectomy.    Exercising: Yes.     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 She has never used smokeless tobacco. She reports current alcohol use of about 3.0 standard drinks of alcohol per week. She reports that she does not use drugs. Preschool teacher. 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      ibuprofen (ADVIL) 200 MG tablet Take 400-600 mg by mouth every 6 (six) hours as needed for headache or cramping.     rizatriptan (MAXALT-MLT) 10 MG disintegrating tablet Take 10 mg by mouth daily as needed for migraine.  2   No current facility-administered medications for this visit.  On doxy for a sinus infection  Family History  Problem Relation Age of Onset   Hypertension Mother    Lung cancer Mother        non smoker    Review of Systems  All other systems reviewed and are negative.   Exam:   BP 126/72   Pulse 85   Ht 5\' 7"  (1.702 m)   Wt 159 lb (72.1 kg)   LMP 03/18/2022 (Approximate)   SpO2 100%   BMI 24.90 kg/m   Weight change: @WEIGHTCHANGE @ Height:   Height: 5\' 7"  (170.2 cm)  Ht Readings from Last 3 Encounters:  03/29/22 5\' 7"  (1.702 m)  06/20/21 5\' 7"  (1.702 m)  02/15/21 5\' 7"  (1.702 m)    General appearance: alert, cooperative and appears stated age Head: Normocephalic, without obvious abnormality, atraumatic Neck: no adenopathy, supple, symmetrical, trachea midline and thyroid normal to inspection and palpation Lungs: clear to auscultation bilaterally Cardiovascular: regular rate and rhythm Breasts: normal appearance, no masses or tenderness Abdomen: soft, non-tender; non distended,  no masses,  no organomegaly Extremities: extremities normal, atraumatic, no cyanosis  or edema Skin: Skin color, texture, turgor normal. No rashes or lesions Lymph nodes: Cervical, supraclavicular, and axillary nodes normal. No abnormal inguinal nodes palpated Neurologic: Grossly normal   Pelvic: External genitalia:  no lesions              Urethra:  normal appearing urethra with no masses, tenderness or lesions              Bartholins and Skenes: normal                 Vagina: normal appearing vagina with normal color and discharge, no lesions              Cervix: no lesions               Bimanual Exam:  Uterus:  normal size, contour, position, consistency, mobility,  non-tender              Adnexa: no mass, fullness, tenderness               Rectovaginal: Confirms               Anus:  normal sphincter tone, no lesions  Carolynn Serve, CMA chaperoned for the exam.  1. Well woman exam Discussed breast self exam Discussed calcium and vit D intake Pap due next year Mammogram UTD  2. Menorrhagia with regular cycle Prior negative w/u, not anemic. No help with POP's, can't take OCP's. Will try lysteda, will check on coverage for a mirean, aware of surgical options - tranexamic acid (LYSTEDA) 650 MG TABS tablet; Take 2 tablets (1,300 mg total) by mouth 3 (three) times daily.  Dispense: 30 tablet; Refill: 11 - IUD Insertion; Future  3. Severe dysmenorrhea Managed with OTC ibuprofen

## 2022-03-29 ENCOUNTER — Ambulatory Visit (INDEPENDENT_AMBULATORY_CARE_PROVIDER_SITE_OTHER): Payer: 59 | Admitting: Obstetrics and Gynecology

## 2022-03-29 ENCOUNTER — Encounter: Payer: Self-pay | Admitting: Obstetrics and Gynecology

## 2022-03-29 VITALS — BP 126/72 | HR 85 | Ht 67.0 in | Wt 159.0 lb

## 2022-03-29 DIAGNOSIS — Z01419 Encounter for gynecological examination (general) (routine) without abnormal findings: Secondary | ICD-10-CM | POA: Diagnosis not present

## 2022-03-29 DIAGNOSIS — N920 Excessive and frequent menstruation with regular cycle: Secondary | ICD-10-CM | POA: Diagnosis not present

## 2022-03-29 DIAGNOSIS — N946 Dysmenorrhea, unspecified: Secondary | ICD-10-CM | POA: Diagnosis not present

## 2022-03-29 MED ORDER — TRANEXAMIC ACID 650 MG PO TABS: 1300.0000 mg | ORAL_TABLET | Freq: Three times a day (TID) | ORAL | 11 refills | Status: AC

## 2022-03-29 NOTE — Patient Instructions (Signed)

## 2022-04-09 ENCOUNTER — Encounter: Payer: Self-pay | Admitting: Obstetrics and Gynecology

## 2022-04-10 NOTE — Telephone Encounter (Signed)
Please schedule office visit for patient for breast problem.

## 2022-04-16 ENCOUNTER — Encounter: Payer: Self-pay | Admitting: Obstetrics and Gynecology

## 2022-04-16 ENCOUNTER — Ambulatory Visit: Payer: 59 | Admitting: Obstetrics and Gynecology

## 2022-04-16 VITALS — BP 122/74 | HR 85 | Wt 160.0 lb

## 2022-04-16 DIAGNOSIS — N644 Mastodynia: Secondary | ICD-10-CM

## 2022-04-16 DIAGNOSIS — Z98891 History of uterine scar from previous surgery: Secondary | ICD-10-CM | POA: Insufficient documentation

## 2022-04-16 DIAGNOSIS — I10 Essential (primary) hypertension: Secondary | ICD-10-CM | POA: Insufficient documentation

## 2022-04-16 NOTE — Patient Instructions (Signed)
Breast Tenderness Breast tenderness is a common problem for women of all ages, but may also occur in men. Breast tenderness has many possible causes, including hormone changes, infections, taking certain medicines, and caffeine intake. In women, the pain usually comes and goes with the menstrual cycle, but it can also be constant. Breast tenderness may range from mild discomfort to severe pain. You may have tests, such as a mammogram or an ultrasound, to check for any unusual findings. Having breast tenderness usually does not mean that you have breast cancer. Follow these instructions at home: Managing pain and discomfort  If directed, put ice on the painful area. To do this: Put ice in a plastic bag. Place a towel between your skin and the bag. Leave the ice on for 20 minutes, 2-3 times a day. If your skin turns bright red, remove the ice right away to prevent skin damage. The risk of skin damage is higher if you cannot feel pain, heat, or cold. Wear a supportive bra or chest support: During exercise. While sleeping, if your breasts are very tender. Medicines Take over-the-counter and prescription medicines only as told by your health care provider. If the cause of your pain is an infection, you may be prescribed an antibiotic medicine. If you were prescribed antibiotics, take them as told by your health care provider. Do not stop using the antibiotic even if you start to feel better. Eating and drinking Decrease the amount of caffeine in your diet. Instead, drink more water and choose caffeine-free drinks. Your health care provider may recommend that you lessen the amount of fat in your diet. You can do this by: Limiting fried foods. Cooking foods using methods such as baking, boiling, grilling, and broiling. General instructions  Keep a log of the days and times when your breasts are most tender. Ask your health care provider how to do breast exams at home. This will help you notice if  you have an unusual growth or lump. Keep all follow-up visits. Contact a health care provider if: Any part of your breast is hard, red, and hot to the touch. This may be a sign of infection. You are a woman and have a new or painful lump in your breast that remains after your menstrual period ends. You are not breastfeeding and you have fluid, especially blood or pus, coming out of your nipples. You have a fever. Your pain does not improve or it gets worse. Your pain is interfering with your daily activities. Summary Breast tenderness may range from mild discomfort to severe pain. Breast tenderness has many possible causes, including hormone changes, infections, taking certain medicines, and caffeine intake. It can be treated with ice, wearing a supportive bra or chest support, and medicines. Make changes to your diet as told by your health care provider. This information is not intended to replace advice given to you by your health care provider. Make sure you discuss any questions you have with your health care provider. Document Revised: 07/12/2021 Document Reviewed: 07/12/2021 Elsevier Patient Education  2023 [employer]  

## 2022-04-16 NOTE — Progress Notes (Signed)
GYNECOLOGY  VISIT   HPI: 41 y.o.   Married [name] Unavailable  female   [phone] with Patient's last menstrual period was 04/14/2022 (approximate).   here for bilateral breast pain. She says that it started about 2 weeks ago. She states that the pain was more intense than it normally is before her period. Typically she just has mild discomfort for a couple of days. This pain was much more intense, symmetrical, improving since her cycle started 2 days ago.  Normal mammogram in 5/23.   She has been on antibiotics and steroids for a URI. No increase in caffeine. She thinks her cycle started early this month, but she doesn't track her cycles consistently. Thinks her PMP was 03/22/22  GYNECOLOGIC HISTORY: Patient's last menstrual period was 04/14/2022 (approximate). Contraception:Vasectomy  Menopausal hormone therapy: none         OB History     Gravida  2   Para  2   Term  2   Preterm      AB      Living  2      SAB      IAB      Ectopic      Multiple      Live Births  2              Patient Active Problem List   Diagnosis Date Noted   Abdominal pain 06/20/2021   Elevated LFTs 06/20/2021   Degeneration of lumbar intervertebral disc 02/04/2021   Migraine with aura    Hyperbilirubinemia 10/24/2016   Cervical high risk HPV (human papillomavirus) test positive 07/23/2014    Past Medical History:  Diagnosis Date   Abnormal Pap smear of cervix    Gilberts disease 2018   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     rizatriptan (MAXALT-MLT) 10 MG disintegrating tablet Take 10 mg by mouth daily as needed for  migraine.  2   tranexamic acid (LYSTEDA) 650 MG TABS tablet Take 2 tablets (1,300 mg total) by mouth 3 (three) times daily. 30 tablet 11   No current facility-administered medications for this visit.     ALLERGIES: Patient has no known allergies.  Family History  Problem Relation Age of Onset   Hypertension Mother    Lung cancer Mother        non smoker    Social History   Socioeconomic History   Marital status: Married    Spouse name: Not on file   Number of children: Not on file   Years of education: Not on file   Highest education level: Not on file  Occupational History   Not on file  Tobacco Use   Smoking status: Never   Smokeless tobacco: Never  Vaping Use   Vaping Use: Never used  Substance and Sexual Activity   Alcohol use: Yes    Alcohol/week: 3.0 standard drinks of alcohol    Types: 3 Standard drinks or equivalent per week   Drug use: No   Sexual activity: Yes    Partners: Male  Birth control/protection: Surgical    Comment: spouse with vasectomy  Other Topics Concern   Not on file  Social History Narrative   Not on file   Social Determinants of Health   Financial Resource Strain: Not on file  Food Insecurity: Not on file  Transportation Needs: Not on file  Physical Activity: Not on file  Stress: Not on file  Social Connections: Not on file  Intimate Partner Violence: Not on file    Review of Systems  Genitourinary:        Breast pain    PHYSICAL EXAMINATION:    BP 122/74   Pulse 85   Wt 160 lb (72.6 kg)   LMP 04/14/2022 (Approximate)   SpO2 100%   BMI 25.06 kg/m     General appearance: alert, cooperative and appears stated age Breasts: normal appearance, no masses or tenderness No supraclavicular or axillary adenopathy.  1. Breast pain Bilateral pain prior to her cycle. Normal exam, mammogram UTD. Patient reassured Call with ongoing concerns Discussed avoiding caffeine, using ice as needed, tylenol or ibuprofen as  needed Information given

## 2022-06-11 ENCOUNTER — Other Ambulatory Visit: Payer: Self-pay | Admitting: Gastroenterology

## 2022-06-11 DIAGNOSIS — K838 Other specified diseases of biliary tract: Secondary | ICD-10-CM

## 2022-06-11 DIAGNOSIS — R7989 Other specified abnormal findings of blood chemistry: Secondary | ICD-10-CM

## 2022-07-09 ENCOUNTER — Ambulatory Visit
Admission: RE | Admit: 2022-07-09 | Discharge: 2022-07-09 | Disposition: A | Discharge status: Home / Self Care | Payer: 59 | Source: Ambulatory Visit | Attending: Gastroenterology | Admitting: Gastroenterology

## 2022-07-09 DIAGNOSIS — R7989 Other specified abnormal findings of blood chemistry: Secondary | ICD-10-CM

## 2022-07-09 DIAGNOSIS — K838 Other specified diseases of biliary tract: Secondary | ICD-10-CM

## 2022-07-09 MED ORDER — GADOPICLENOL 0.5 MMOL/ML IV SOLN
7.5000 mL | Freq: Once | INTRAVENOUS | Status: AC | PRN
  Administered 2022-07-09: 7.5 mL via INTRAVENOUS

## 2023-04-01 ENCOUNTER — Ambulatory Visit: Payer: 59 | Admitting: Obstetrics and Gynecology

## 2023-11-17 IMAGING — MG MM DIGITAL SCREENING BILAT W/ TOMO AND CAD
8 series · 9 of 24 positions shown · non-contrast
Comparison: Previous exam(s).

CLINICAL DATA: Screening.

EXAM:
DIGITAL SCREENING BILATERAL MAMMOGRAM WITH TOMOSYNTHESIS AND CAD
TECHNIQUE: Bilateral screening digital craniocaudal and mediolateral oblique
mammograms were obtained. Bilateral screening digital breast
tomosynthesis was performed. The images were evaluated with
computer-aided detection.

[R CC synth-2D]
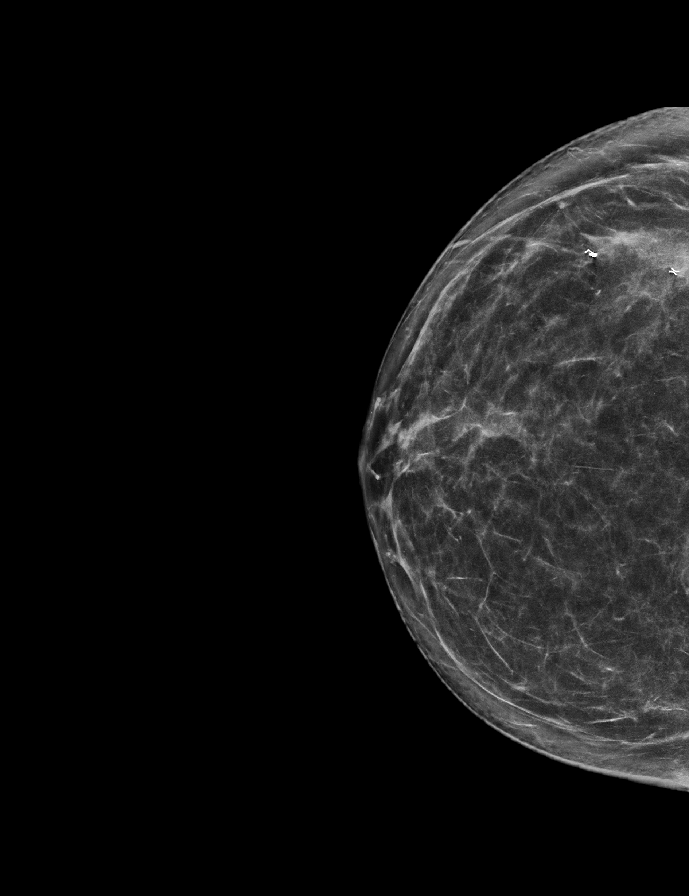

[L MLO synth-2D]
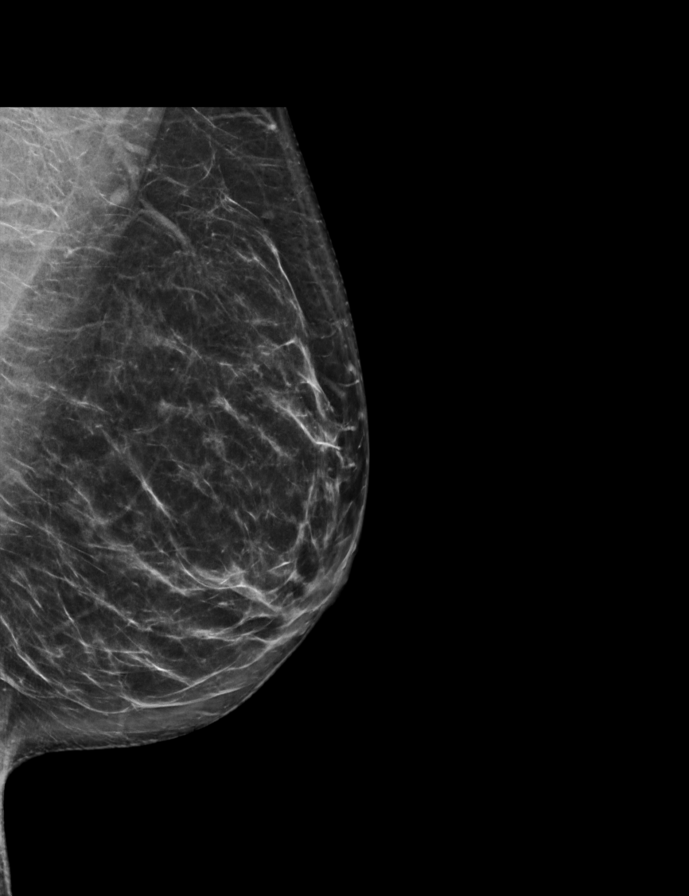

[L CC synth-2D]
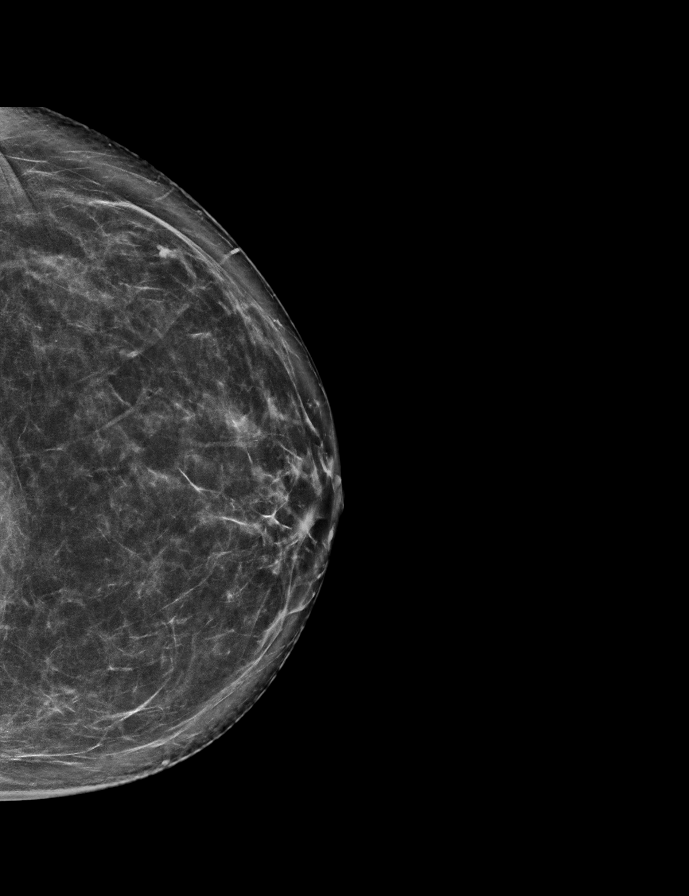

[R MLO synth-2D]
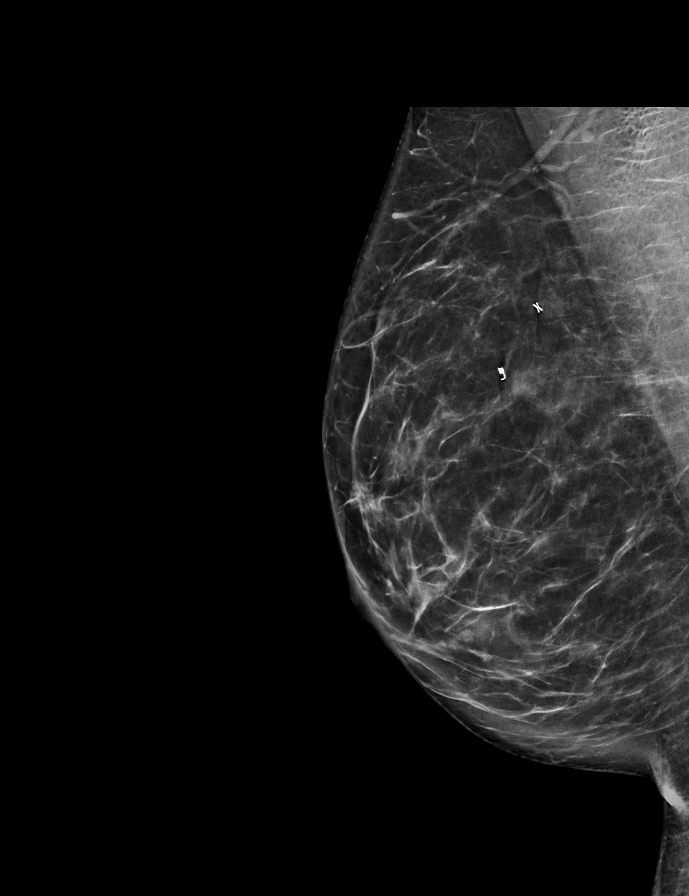

[L CC tomo · 2 of 77 frames shown]
[frame 25/77]
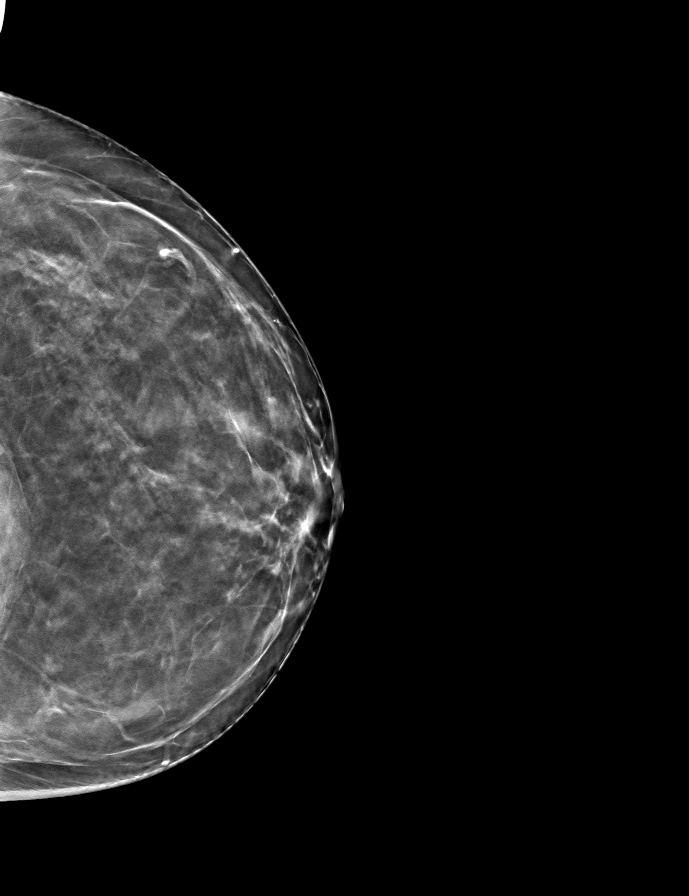
[frame 39/77]
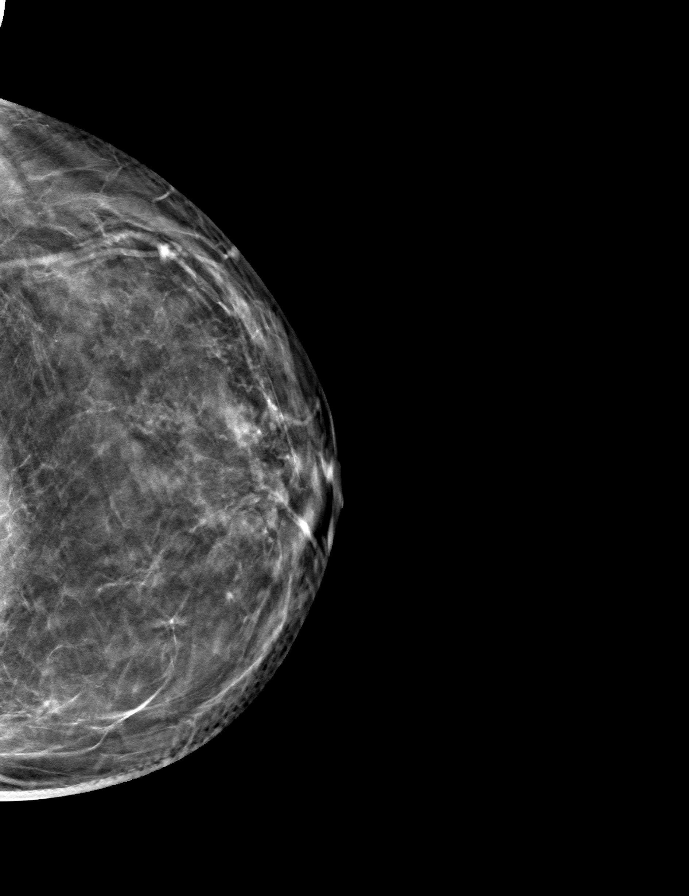

[L MLO tomo · tomo slice 39/77.0]
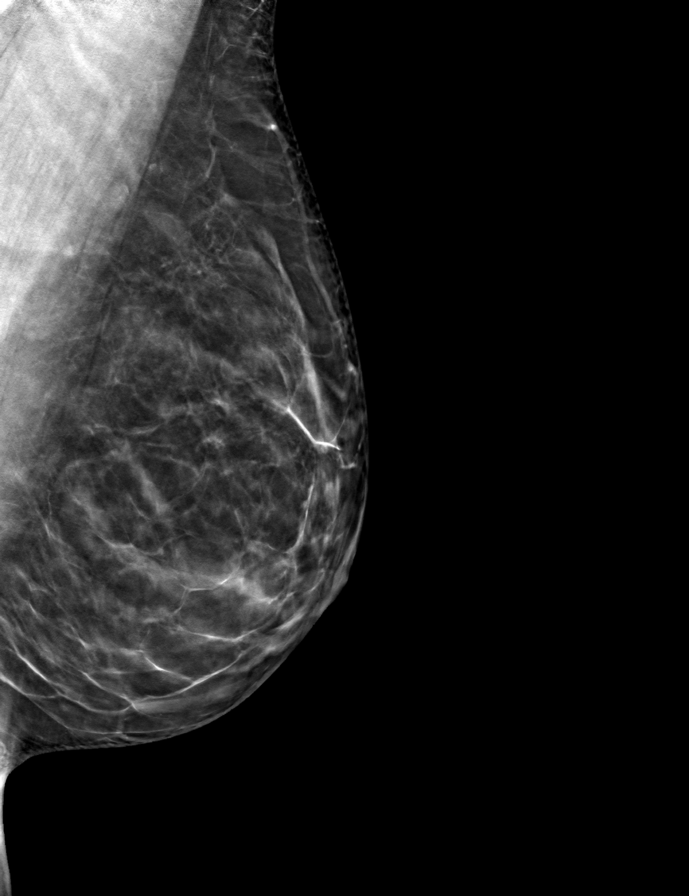

[R CC tomo · tomo slice 35/69.0]
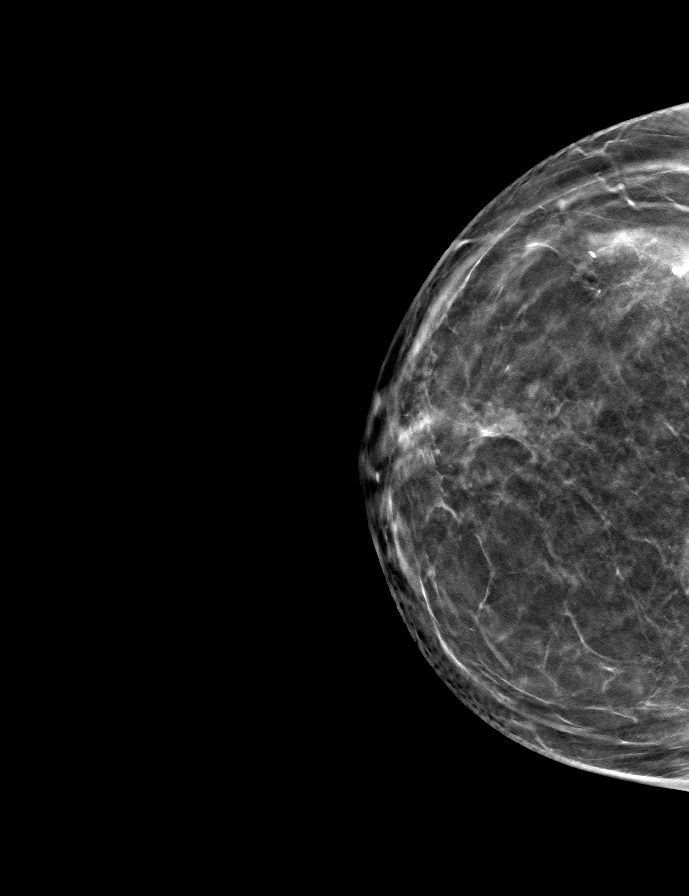

[R MLO tomo · tomo slice 41/81.0]
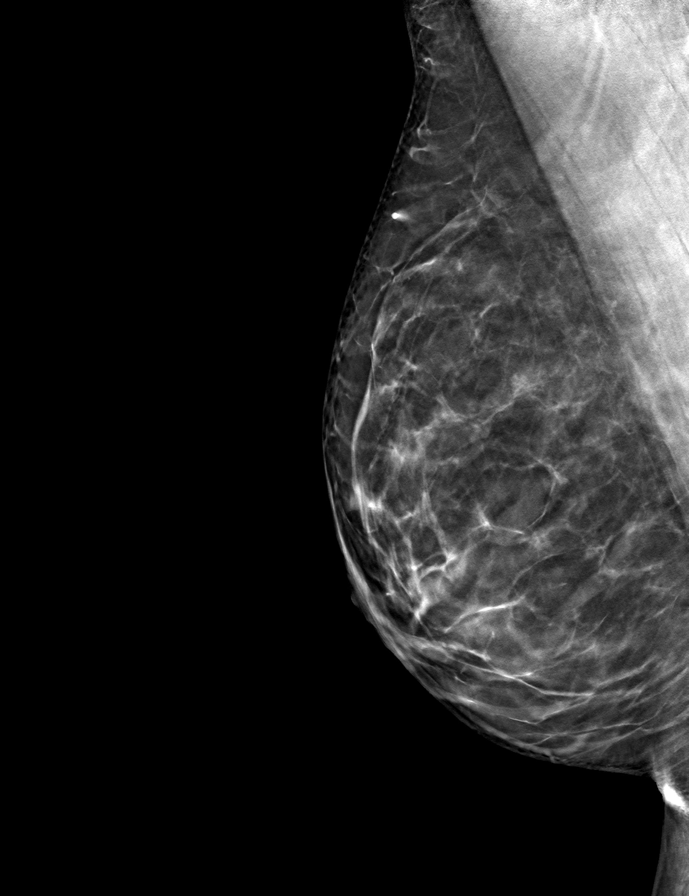

[9 of 24 positions shown; findings below may reference images not displayed]

ACR Breast Density Category b: There are scattered areas of
fibroglandular density.
FINDINGS: There are no findings suspicious for malignancy.
IMPRESSION: No mammographic evidence of malignancy. A result letter of this
screening mammogram will be mailed directly to the patient.

RECOMMENDATION:
Screening mammogram in one year. (Code:51-O-LD2)

BI-RADS CATEGORY  1: Negative.
